# Patient Record
Sex: Female | Born: 1955 | Race: White | Marital: Married | Smoking: Light tobacco smoker
Health system: Southern US, Community
[De-identification: ages and names within clinical notes are randomized; demographics above are authoritative.]

## PROBLEM LIST (undated history)

## (undated) DIAGNOSIS — M797 Fibromyalgia: Secondary | ICD-10-CM

## (undated) DIAGNOSIS — G4733 Obstructive sleep apnea (adult) (pediatric): Secondary | ICD-10-CM

## (undated) DIAGNOSIS — J449 Chronic obstructive pulmonary disease, unspecified: Secondary | ICD-10-CM

## (undated) DIAGNOSIS — K5792 Diverticulitis of intestine, part unspecified, without perforation or abscess without bleeding: Secondary | ICD-10-CM

## (undated) DIAGNOSIS — F411 Generalized anxiety disorder: Secondary | ICD-10-CM

## (undated) DIAGNOSIS — M1990 Unspecified osteoarthritis, unspecified site: Secondary | ICD-10-CM

## (undated) DIAGNOSIS — I5032 Chronic diastolic (congestive) heart failure: Secondary | ICD-10-CM

## (undated) DIAGNOSIS — N289 Disorder of kidney and ureter, unspecified: Secondary | ICD-10-CM

## (undated) DIAGNOSIS — F909 Attention-deficit hyperactivity disorder, unspecified type: Secondary | ICD-10-CM

## (undated) DIAGNOSIS — A159 Respiratory tuberculosis unspecified: Secondary | ICD-10-CM

## (undated) DIAGNOSIS — T7840XA Allergy, unspecified, initial encounter: Secondary | ICD-10-CM

## (undated) DIAGNOSIS — G4730 Sleep apnea, unspecified: Secondary | ICD-10-CM

## (undated) DIAGNOSIS — G43909 Migraine, unspecified, not intractable, without status migrainosus: Secondary | ICD-10-CM

## (undated) DIAGNOSIS — N189 Chronic kidney disease, unspecified: Secondary | ICD-10-CM

## (undated) DIAGNOSIS — F329 Major depressive disorder, single episode, unspecified: Secondary | ICD-10-CM

## (undated) DIAGNOSIS — I700 Atherosclerosis of aorta: Secondary | ICD-10-CM

## (undated) DIAGNOSIS — D126 Benign neoplasm of colon, unspecified: Secondary | ICD-10-CM

## (undated) DIAGNOSIS — F419 Anxiety disorder, unspecified: Secondary | ICD-10-CM

## (undated) DIAGNOSIS — J45909 Unspecified asthma, uncomplicated: Secondary | ICD-10-CM

## (undated) DIAGNOSIS — I471 Supraventricular tachycardia, unspecified: Secondary | ICD-10-CM

## (undated) DIAGNOSIS — Z87820 Personal history of traumatic brain injury: Secondary | ICD-10-CM

## (undated) HISTORY — DX: Benign neoplasm of colon, unspecified: D12.6

## (undated) HISTORY — DX: Attention-deficit hyperactivity disorder, unspecified type: F90.9

## (undated) HISTORY — DX: Chronic kidney disease, unspecified: N18.9

## (undated) HISTORY — DX: Chronic diastolic (congestive) heart failure: I50.32

## (undated) HISTORY — DX: Anxiety disorder, unspecified: F41.9

## (undated) HISTORY — DX: Unspecified osteoarthritis, unspecified site: M19.90

## (undated) HISTORY — DX: Respiratory tuberculosis unspecified: A15.9

## (undated) HISTORY — PX: BACK SURGERY: SHX140

## (undated) HISTORY — DX: Atherosclerosis of aorta: I70.0

## (undated) HISTORY — DX: Migraine, unspecified, not intractable, without status migrainosus: G43.909

## (undated) HISTORY — DX: Generalized anxiety disorder: F41.1

## (undated) HISTORY — PX: ESOPHAGOGASTRODUODENOSCOPY: SHX1529

## (undated) HISTORY — PX: TUBAL LIGATION: SHX77

## (undated) HISTORY — DX: Supraventricular tachycardia, unspecified: I47.10

## (undated) HISTORY — DX: Chronic obstructive pulmonary disease, unspecified: J44.9

## (undated) HISTORY — DX: Sleep apnea, unspecified: G47.30

## (undated) HISTORY — DX: Obstructive sleep apnea (adult) (pediatric): G47.33

## (undated) HISTORY — PX: COLONOSCOPY: SHX174

## (undated) HISTORY — DX: Personal history of traumatic brain injury: Z87.820

## (undated) HISTORY — DX: Diverticulitis of intestine, part unspecified, without perforation or abscess without bleeding: K57.92

## (undated) HISTORY — DX: Unspecified asthma, uncomplicated: J45.909

## (undated) HISTORY — DX: Major depressive disorder, single episode, unspecified: F32.9

## (undated) HISTORY — DX: Allergy, unspecified, initial encounter: T78.40XA

## (undated) HISTORY — DX: Fibromyalgia: M79.7

---
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
## 2007-04-28 DIAGNOSIS — I639 Cerebral infarction, unspecified: Secondary | ICD-10-CM

## 2007-04-28 DIAGNOSIS — G459 Transient cerebral ischemic attack, unspecified: Secondary | ICD-10-CM

## 2007-04-28 HISTORY — DX: Transient cerebral ischemic attack, unspecified: G45.9

## 2007-04-28 HISTORY — DX: Cerebral infarction, unspecified: I63.9

## 2016-08-10 DIAGNOSIS — M48061 Spinal stenosis, lumbar region without neurogenic claudication: Secondary | ICD-10-CM | POA: Insufficient documentation

## 2016-08-10 HISTORY — DX: Spinal stenosis, lumbar region without neurogenic claudication: M48.061

## 2017-07-14 DIAGNOSIS — M21372 Foot drop, left foot: Secondary | ICD-10-CM | POA: Insufficient documentation

## 2017-07-14 DIAGNOSIS — M545 Low back pain, unspecified: Secondary | ICD-10-CM

## 2017-07-14 HISTORY — DX: Foot drop, left foot: M21.372

## 2017-07-14 HISTORY — DX: Low back pain, unspecified: M54.50

## 2019-03-28 ENCOUNTER — Other Ambulatory Visit: Payer: Self-pay | Admitting: Family Medicine

## 2019-03-28 DIAGNOSIS — R479 Unspecified speech disturbances: Secondary | ICD-10-CM

## 2019-04-12 ENCOUNTER — Other Ambulatory Visit: Payer: Self-pay | Admitting: Family Medicine

## 2019-04-13 ENCOUNTER — Other Ambulatory Visit: Payer: Self-pay | Admitting: Family Medicine

## 2019-04-17 ENCOUNTER — Other Ambulatory Visit: Payer: Self-pay | Admitting: Family Medicine

## 2019-04-18 ENCOUNTER — Ambulatory Visit
Admission: RE | Admit: 2019-04-18 | Discharge: 2019-04-18 | Disposition: A | Discharge status: Home / Self Care | Payer: Medicare Other | Source: Ambulatory Visit | Attending: Family Medicine | Admitting: Family Medicine

## 2019-04-18 ENCOUNTER — Other Ambulatory Visit: Payer: Self-pay

## 2019-04-18 DIAGNOSIS — R479 Unspecified speech disturbances: Secondary | ICD-10-CM

## 2019-04-18 IMAGING — MR MR HEAD W/O CM
11 series · 48 of 48 positions shown · non-contrast
Comparison: None.

CLINICAL DATA: Episodic ataxia and slurred speech.

EXAM:
MRI HEAD WITHOUT CONTRAST
TECHNIQUE: Multiplanar, multiecho pulse sequences of the brain and surrounding
structures were obtained without intravenous contrast.

[Series 5: T1 · sagittal · 4.0mm · 0.75mm/px · 3 of 31 slices shown (1 of 3)]
[im 1/31]
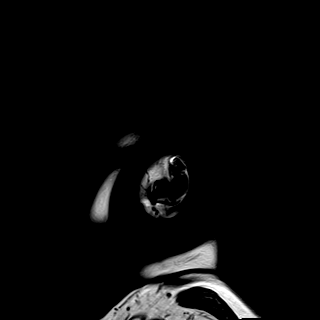
[im 16/31]
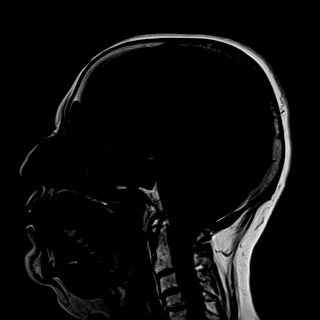
[im 31/31]
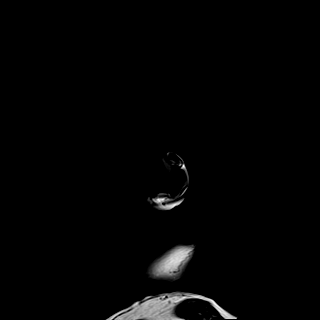

[Series 6: DWI · axial · 3.0mm · 1.44mm/px · z∈[-67,+75]mm · 7 of 88 slices shown (1 of 4)]
[im 1/88]
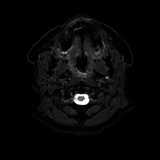
[im 15/88]
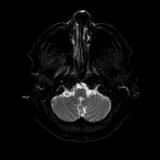
[im 30/88]
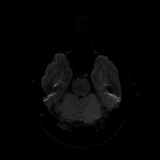
[im 44/88]
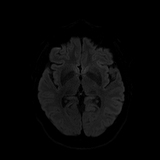
[im 59/88]
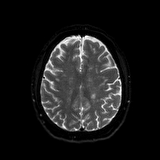
[im 73/88]
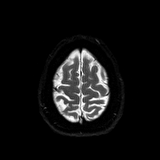
[im 88/88]
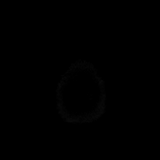

[Series 7: DWI · axial · 3.0mm · 1.44mm/px · z∈[-67,+75]mm · 3 of 43 slices shown (2 of 4)]
[im 1/43]
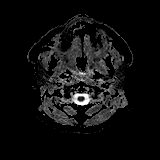
[im 22/43]
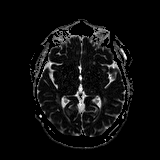
[im 43/43]
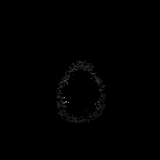

[Series 8: DWI · coronal · 5.0mm · 1.44mm/px · 5 of 62 slices shown (3 of 4)]
[im 1/62]
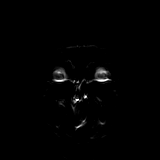
[im 16/62]
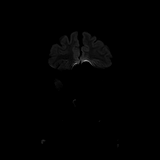
[im 31/62]
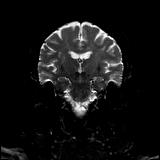
[im 46/62]
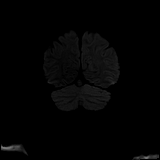
[im 62/62]
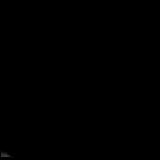

[Series 9: DWI · coronal · 5.0mm · 1.44mm/px · 2 of 30 slices shown (4 of 4)]
[im 1/30]
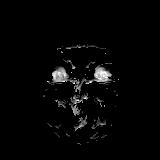
[im 30/30]
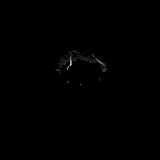

[Series 10: T1 · sagittal · 4.0mm · 0.75mm/px · 2 of 31 slices shown (2 of 3)]
[im 1/31]
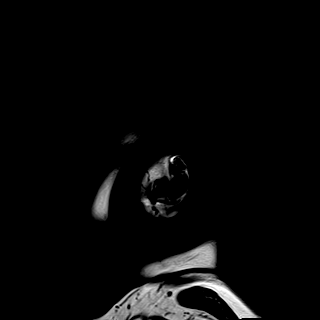
[im 31/31]
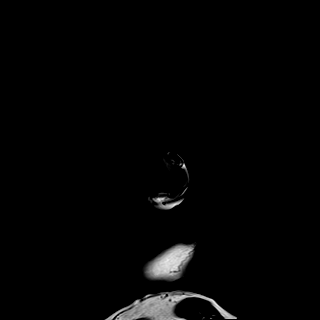

[Series 11: T2 · axial · 4.0mm · 0.36mm/px · z∈[-67,+78]mm · 2 of 29 slices shown (1 of 2)]
[im 1/29]
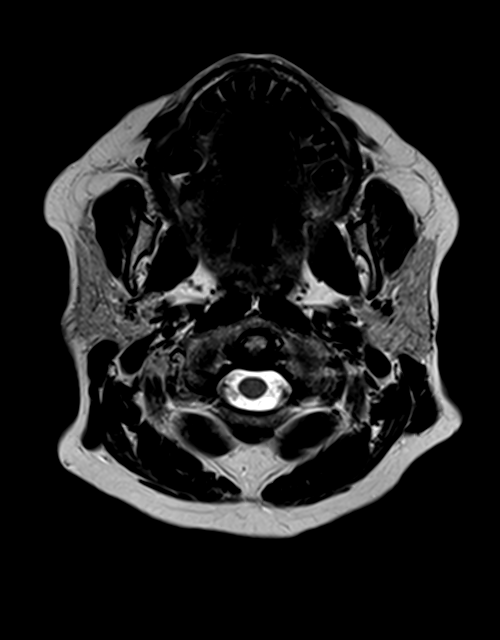
[im 29/29]
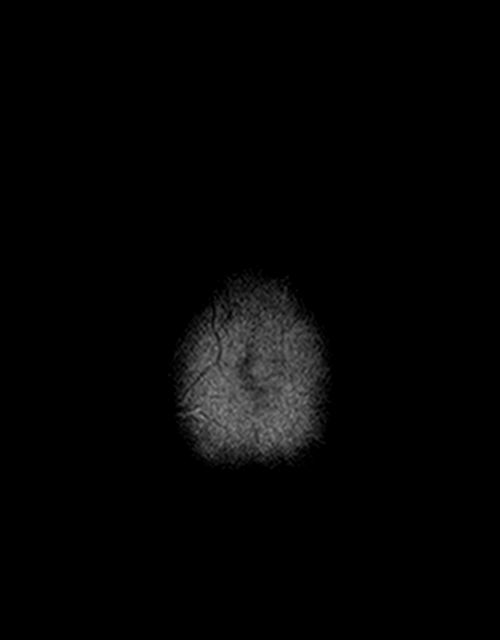

[Series 12: FLAIR · axial · 3.0mm · 0.72mm/px · z∈[-71,+79]mm · 2 of 26 slices shown]
[im 1/26]
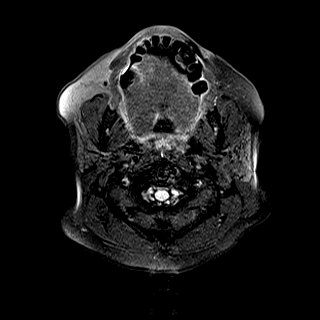
[im 26/26]
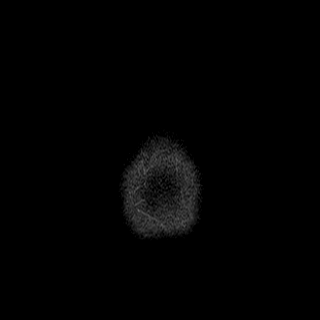

[Series 14: swi_images · axial · 1.5mm · 0.90mm/px · z∈[-66,+76]mm · 8 of 96 slices shown]
[im 1/96]
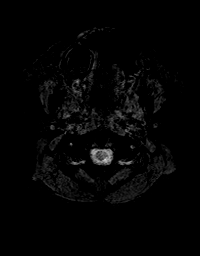
[im 14/96]
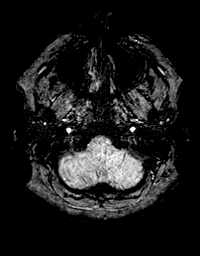
[im 28/96]
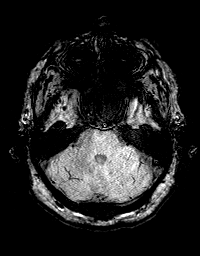
[im 41/96]
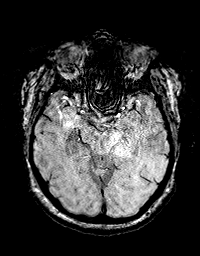
[im 55/96]
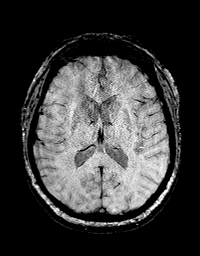
[im 68/96]
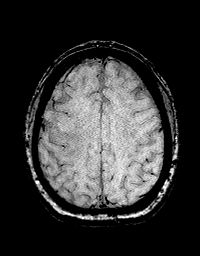
[im 82/96]
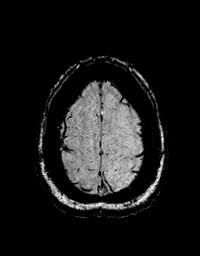
[im 96/96]
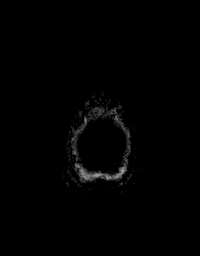

[Series 15: T1 · axial · 1.0mm · 0.94mm/px · z∈[-65,+77]mm · 11 of 144 slices shown (3 of 3)]
[im 1/144]
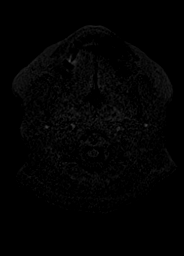
[im 15/144]
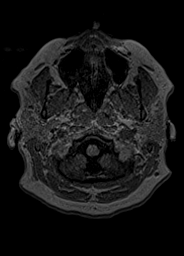
[im 29/144]
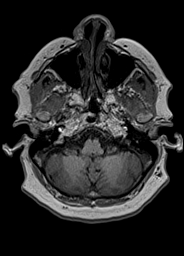
[im 43/144]
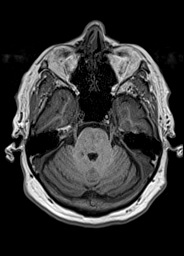
[im 58/144]
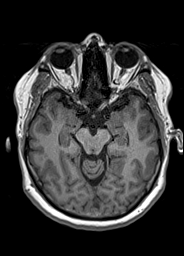
[im 72/144]
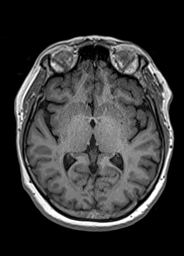
[im 86/144]
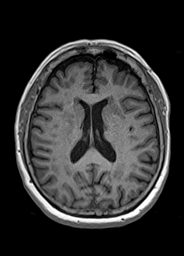
[im 101/144]
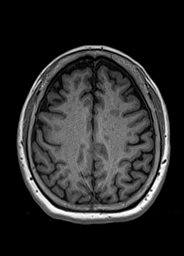
[im 115/144]
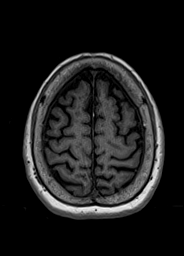
[im 129/144]
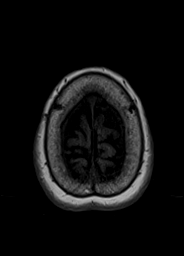
[im 144/144]
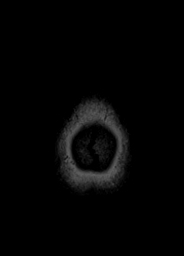

[Series 16: T2 · coronal · 4.5mm · 0.36mm/px · 3 of 32 slices shown (2 of 2)]
[im 1/32]
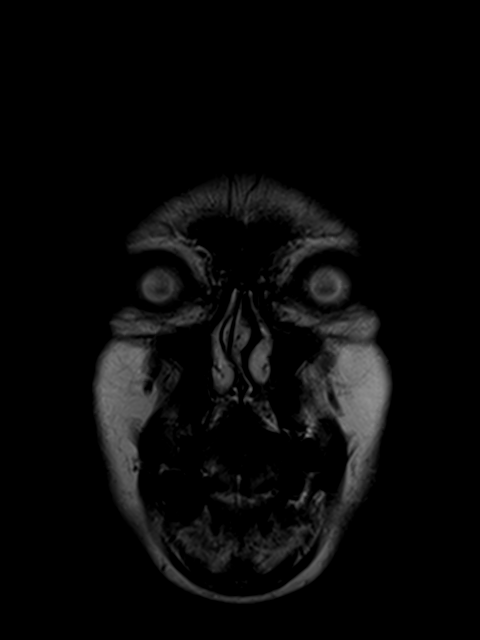
[im 16/32]
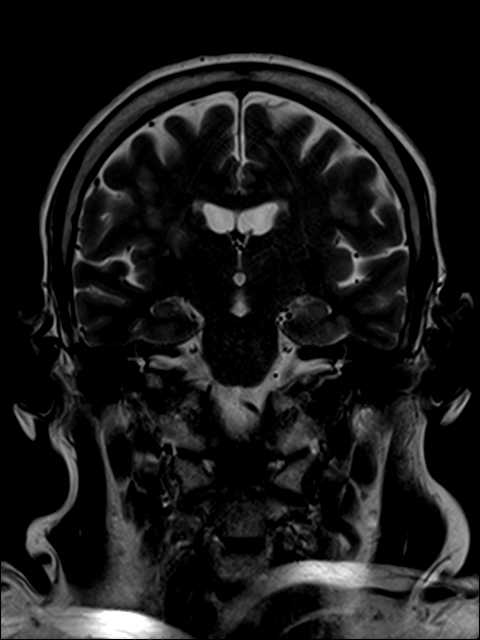
[im 32/32]
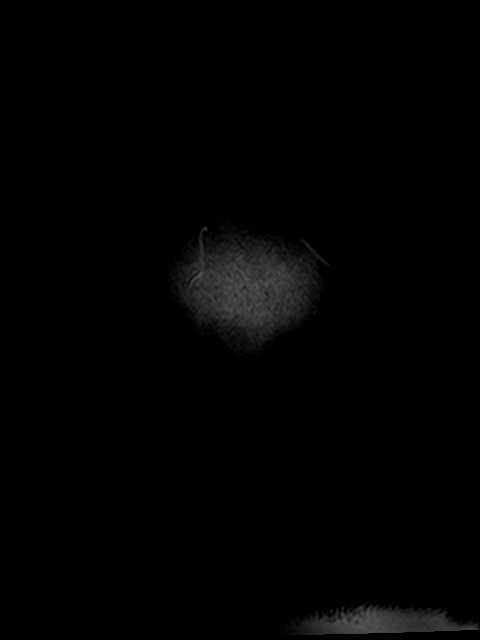

[48 of 48 positions shown; findings below may reference images not displayed]

FINDINGS: BRAIN: There is no acute infarct, acute hemorrhage or extra-axial
collection. Early confluent hyperintense T2-weighted signal of the
periventricular and deep white matter, most commonly due to chronic
ischemic microangiopathy. The cerebral and cerebellar volume are
age-appropriate. There is no hydrocephalus. The midline structures
are normal.

VASCULAR: The major intracranial arterial and venous sinus flow
voids are normal. Susceptibility-sensitive sequences show no chronic
microhemorrhage or superficial siderosis.

SKULL AND UPPER CERVICAL SPINE: Calvarial bone marrow signal is
normal. There is no skull base mass. The visualized upper cervical
spine and soft tissues are normal.

SINUSES/ORBITS: There are no fluid levels or advanced mucosal
thickening. The mastoid air cells and middle ear cavities are free
of fluid. The orbits are normal.
IMPRESSION: 1. No acute intracranial abnormality.
2. Moderate chronic small vessel disease.

## 2019-04-26 ENCOUNTER — Telehealth: Payer: Self-pay | Admitting: *Deleted

## 2019-04-26 ENCOUNTER — Other Ambulatory Visit: Payer: Self-pay | Admitting: *Deleted

## 2019-04-26 DIAGNOSIS — R55 Syncope and collapse: Secondary | ICD-10-CM

## 2019-04-26 NOTE — Telephone Encounter (Signed)
14 day ZIO XT long term holter monitor mailed to patients home.  Instructions reviewed briefly as they are included in the monitor kit. 

## 2019-05-01 ENCOUNTER — Other Ambulatory Visit (INDEPENDENT_AMBULATORY_CARE_PROVIDER_SITE_OTHER): Payer: Medicare Other

## 2019-05-01 DIAGNOSIS — R55 Syncope and collapse: Secondary | ICD-10-CM | POA: Diagnosis not present

## 2019-06-02 ENCOUNTER — Encounter: Payer: Self-pay | Admitting: Interventional Cardiology

## 2019-06-02 ENCOUNTER — Ambulatory Visit: Payer: Medicare Other | Admitting: Interventional Cardiology

## 2019-06-02 ENCOUNTER — Other Ambulatory Visit: Payer: Self-pay

## 2019-06-02 VITALS — BP 116/80 | HR 95 | Ht 59.0 in | Wt 181.0 lb

## 2019-06-02 DIAGNOSIS — I471 Supraventricular tachycardia: Secondary | ICD-10-CM | POA: Diagnosis not present

## 2019-06-02 DIAGNOSIS — Z720 Tobacco use: Secondary | ICD-10-CM

## 2019-06-02 DIAGNOSIS — R55 Syncope and collapse: Secondary | ICD-10-CM

## 2019-06-02 DIAGNOSIS — M79605 Pain in left leg: Secondary | ICD-10-CM

## 2019-06-02 NOTE — Patient Instructions (Signed)
Medication Instructions:  Your physician recommends that you continue on your current medications as directed. Please refer to the Current Medication list given to you today.  *If you need a refill on your cardiac medications before your next appointment, please call your pharmacy*  Lab Work: Your physician recommends that you return for a FASTING lipid profile  If you have labs (blood work) drawn today and your tests are completely normal, you will receive your results only by: Marland Kitchen MyChart Message (if you have MyChart) OR . A paper copy in the mail If you have any lab test that is abnormal or we need to change your treatment, we will call you to review the results.  Testing/Procedures: Your physician has requested that you have an echocardiogram. Echocardiography is a painless test that uses sound waves to create images of your heart. It provides your doctor with information about the size and shape of your heart and how well your heart's chambers and valves are working. This procedure takes approximately one hour. There are no restrictions for this procedure.    Follow-Up: At [facility], you and your health needs are our priority.  As part of our continuing mission to provide you with exceptional heart care, we have created designated Provider Care Teams.  These Care Teams include your primary Cardiologist (physician) and Advanced Practice Providers (APPs -  Physician Assistants and Nurse Practitioners) who all work together to provide you with the care you need, when you need it.  Your next appointment:   6 month(s)  The format for your next appointment:   In Person  Provider:   You may see Larae Grooms, MD or one of the following Advanced Practice Providers on your designated Care Team:    Melina Copa, PA-C  Ermalinda Barrios, PA-C   Other Instructions

## 2019-06-02 NOTE — Progress Notes (Signed)
Cardiology Office Note   Date:  06/02/2019   ID:  Angelica Carter, DOB 1955-06-27, MRN [id number]  PCP:  Buzzy Han, MD    No chief complaint on file.  SVT/syncope  Wt Readings from Last 3 Encounters:  06/02/19 181 lb (82.1 kg)       History of Present Illness: Angelica Carter is a 64 y.o. female who is being seen today for the evaluation of syncope at the request of [place]*.  She has had several episodes of "falling out" over the past few years.  From 12/2018-03/2019, she had 3 episodes.  In 03/2019, she was getting up from a chair and then fell backwards.  In 01/2019, she passed out while in a chair when visiting [place].  She went to the hospital and was found to be dehydrated and have low potassium.   Since 03/2019, no syncopal episodes.  She walks regularly, 3x/day.  Limited by left leg pain.  She has some pain in her calf with walking on the left.  SHe last had a stress test in 2019 in [place].  Denies : Chest pain. Leg edema. Nitroglycerin use. Orthopnea. Palpitations. Paroxysmal nocturnal dyspnea. Shortness of breath.   Past Medical History:  Diagnosis Date  . 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 Left foot drop 07/14/2017  . Low back pain 07/14/2017       Current Outpatient Medications  Medication Sig Dispense Refill  . albuterol (VENTOLIN HFA) 108 (90 Base) MCG/ACT inhaler Inhale into the lungs every 6 (six) hours as needed for wheezing or shortness of breath.    Marland Kitchen amLODipine (NORVASC) 10 MG tablet Take 10 mg by mouth daily.    . pregabalin (LYRICA) 200 MG capsule Take 200 mg by mouth 3 (three) times daily.    Orlie Dakin SODIUM PO Take by mouth.    . SUMAtriptan (IMITREX) 25 MG tablet Take 25 mg by mouth every 2 (two) hours as needed for migraine. May repeat in 2 hours if headache persists or recurs.    . traZODone (DESYREL) 50 MG tablet Take 75 mg by mouth at bedtime.     No current  facility-administered medications for this visit.    Allergies:   Cyclobenzaprine    Social History:  The patient  reports that she has been smoking. She has never used smokeless tobacco. She reports that she does not drink alcohol or use drugs.   Family History:  The patient's family history includes Heart disease in her father.    ROS:  Please see the history of present illness.   Otherwise, review of systems are positive for syncope several months ago; chronic leg pain.   All other systems are reviewed and negative.    PHYSICAL EXAM: VS:  BP 116/80   Pulse 95   Ht 4\' 11"  (1.499 m)   Wt 181 lb (82.1 kg)   SpO2 96%   BMI 36.56 kg/m  , BMI Body mass index is 36.56 kg/m. GEN: Well nourished, well developed, in no acute distress  HEENT: normal  Neck: no JVD, carotid bruits, or masses Cardiac: RRR; no murmurs, rubs, or gallops,no edema  Respiratory:  clear to auscultation bilaterally, normal work of breathing GI: soft, nontender, nondistended, + BS MS: no deformity or atrophy ; 2+ posterior tibial pulses bilaterally Skin: warm and dry, no rash Neuro:  Strength and sensation are intact Psych: euthymic mood, full affect   EKG:   The ekg ordered today demonstrates normal sinus rhythm, heart  rate at the upper end of normal range, no significant ST changes   Recent Labs: No results found for requested labs within last 8760 hours.   Lipid Panel No results found for: CHOL, TRIG, HDL, CHOLHDL, VLDL, LDLCALC, LDLDIRECT   Other studies Reviewed: Additional studies/ records that were reviewed today with results demonstrating: Labs from primary care doctor reviewed and electrolytes well-controlled..   ASSESSMENT AND PLAN:  1. Syncope: Check echo to look for  Structural heart issues.  She reports having a normal stress test done in 2019 while in [place].  No anginal symptoms at this time.  Zio monitor reviewed.  No sustained arrhythmias or anything that would explain syncope.   She did have short-lived SVT noted. 2. Leg pain: Palpable posterior tibial pulses bilaterally.  She has had multiple back surgeries.  This may be more of a neurologic issue. 3. Tobacco abuse: She needs to stop smoking. 4. I encouraged her to stay well-hydrated.  Healthy diet recommended.   Current medicines are reviewed at length with the patient today.  The patient concerns regarding her medicines were addressed.  The following changes have been made:  No change  Labs/ tests ordered today include: echo No orders of the defined types were placed in this encounter.   Recommend 150 minutes/week of aerobic exercise Low fat, low carb, high fiber diet recommended  Disposition:   FU in 6 months   Signed, Larae Grooms, MD  06/02/2019 10:09 AM    [facility] [place], [place], [place]  [address] Phone: [phone]; Fax: [phone]

## 2019-06-16 ENCOUNTER — Other Ambulatory Visit: Payer: Medicare Other

## 2019-06-16 ENCOUNTER — Other Ambulatory Visit (HOSPITAL_COMMUNITY): Payer: Medicare Other

## 2019-06-30 ENCOUNTER — Other Ambulatory Visit: Payer: Medicare Other | Admitting: *Deleted

## 2019-06-30 ENCOUNTER — Other Ambulatory Visit: Payer: Self-pay

## 2019-06-30 ENCOUNTER — Ambulatory Visit (HOSPITAL_COMMUNITY): Payer: Medicare Other | Attending: Interventional Cardiology

## 2019-06-30 DIAGNOSIS — R55 Syncope and collapse: Secondary | ICD-10-CM | POA: Insufficient documentation

## 2019-06-30 LAB — LIPID PANEL
Chol/HDL Ratio: 4.9 ratio — ABNORMAL HIGH (ref 0.0–4.4)
Cholesterol, Total: 186 mg/dL (ref 100–199)
HDL: 38 mg/dL — ABNORMAL LOW (ref >39)
LDL Chol Calc (NIH): 117 mg/dL — ABNORMAL HIGH (ref 0–99)
Triglycerides: 173 mg/dL — ABNORMAL HIGH (ref 0–149)
VLDL Cholesterol Cal: 31 mg/dL (ref 5–40)

## 2019-07-27 ENCOUNTER — Encounter: Payer: Self-pay | Admitting: Physician Assistant

## 2019-08-11 ENCOUNTER — Ambulatory Visit: Payer: Medicare Other | Admitting: Physician Assistant

## 2019-08-17 ENCOUNTER — Telehealth: Payer: Self-pay | Admitting: Physician Assistant

## 2019-08-17 ENCOUNTER — Ambulatory Visit: Payer: Medicare Other | Admitting: Physician Assistant

## 2019-08-17 ENCOUNTER — Encounter: Payer: Self-pay | Admitting: Physician Assistant

## 2019-08-17 VITALS — BP 142/92 | HR 100 | Temp 98.2°F | Ht 59.0 in | Wt 179.0 lb

## 2019-08-17 DIAGNOSIS — Z86010 Personal history of colonic polyps: Secondary | ICD-10-CM

## 2019-08-17 DIAGNOSIS — R1013 Epigastric pain: Secondary | ICD-10-CM

## 2019-08-17 DIAGNOSIS — R195 Other fecal abnormalities: Secondary | ICD-10-CM | POA: Diagnosis not present

## 2019-08-17 DIAGNOSIS — R1031 Right lower quadrant pain: Secondary | ICD-10-CM

## 2019-08-17 DIAGNOSIS — R1032 Left lower quadrant pain: Secondary | ICD-10-CM

## 2019-08-17 MED ORDER — SUTAB 1479-225-188 MG PO TABS: 1.0000 | ORAL_TABLET | Freq: Once | ORAL | 0 refills | Status: AC

## 2019-08-17 NOTE — Patient Instructions (Addendum)
If you are age 65 or older, your body mass index should be between 23-30. Your Body mass index is 36.15 kg/m. If this is out of the aforementioned range listed, please consider follow up with your Primary Care Provider.  If you are age 2 or younger, your body mass index should be between 19-25. Your Body mass index is 36.15 kg/m. If this is out of the aformentioned range listed, please consider follow up with your Primary Care Provider.   Please purchase the following medications over the counter and take as directed: Omeprazole 20 mg daily, 30-60 minutes before breakfast for 2 weeks to see if helps with gas/epigastric pain.   You have been scheduled for a colonoscopy. Please follow written instructions given to you at your visit today.  Please pick up your prep supplies at the pharmacy within the next 1-3 days. If you use inhalers (even only as needed), please bring them with you on the day of your procedure. Marland Kitchen

## 2019-08-17 NOTE — Progress Notes (Addendum)
Chief Complaint: Positive FIT test, diarrhea and abdominal pain  HPI:    Angelica Carter is a 64 year old female with a past medical history as listed below (06/30/2019 echo with a normal LVEF at 65-70%), who was referred to me by Buzzy Han* for a complaint of positive fit test, diarrhea and abdominal pain.      05/19/2019 patient seen by her PCP for follow-up.  At that time patient had a fit test ordered which returned positive.  She was referred to our clinic.    Today, the patient tells me that she has a GI history of diverticulitis in the past as well as colon polyps with her last colonoscopy at least 7 years ago when she lived in [place].  Currently describes that she continues with a lower abdominal pain described as a cramping rated as a 7/10 which happens anytime that she eats, typically within an hour after eating.  This will last for a few hours and then go away and does not necessarily result in a bowel movement.  This has been going on for 2 years and apparently IBS has been discussed in the past.  Tells me now that her stools are rarely formed, though she does have some formed stools here and there and there is also some mucus on the stool.    Also describes a history of reflux but tells me she is not currently having issues with this.  Has used Tums as needed in the past.  Does relate a history of a lot of gas.    Denies fever, chills, weight loss or symptoms that awaken her from sleep.  Past Medical History:  Diagnosis Date  . 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 Kidney disease    only has 1 kidney   . Left foot drop 07/14/2017  . Low back pain 07/14/2017    Past Surgical History:  Procedure Laterality Date  . BACK SURGERY     x 4   . TUBAL LIGATION      Current Outpatient Medications  Medication Sig Dispense Refill  . albuterol (VENTOLIN HFA) 108 (90 Base) MCG/ACT inhaler Inhale into the lungs every 6 (six) hours as needed for wheezing or  shortness of breath.    Marland Kitchen amLODipine (NORVASC) 10 MG tablet Take 10 mg by mouth daily.    . pregabalin (LYRICA) 200 MG capsule Take 200 mg by mouth 3 (three) times daily.    Orlie Dakin SODIUM PO Take by mouth.    . SUMAtriptan (IMITREX) 25 MG tablet Take 25 mg by mouth every 2 (two) hours as needed for migraine. May repeat in 2 hours if headache persists or recurs.    . traZODone (DESYREL) 50 MG tablet Take 75 mg by mouth at bedtime.     No current facility-administered medications for this visit.    Allergies as of 08/17/2019 - Review Complete 08/17/2019  Allergen Reaction Noted  . Cyclobenzaprine Other (See Comments) 06/02/2019    Family History  Problem Relation Age of Onset  . Colon cancer Mother 30  . Heart disease Father   . Colon polyps Sister   . Stomach cancer Neg Hx   . Pancreatic cancer Neg Hx   . Esophageal cancer Neg Hx     Social History   Socioeconomic History  . Marital status: Married    Spouse name: Not on file  . Number of children: Not on file  . Years of education: Not on file  . Highest education  level: Not on file  Occupational History  . Not on file  Tobacco Use  . Smoking status: Light Tobacco Smoker  . Smokeless tobacco: Never Used  Substance and Sexual Activity  . Alcohol use: Never  . Drug use: Never  . Sexual activity: Not on file  Other Topics Concern  . Not on file  Social History Narrative  . Not on file   Social Determinants of Health   Financial Resource Strain:   . Difficulty of Paying Living Expenses:   Food Insecurity:   . Worried About Charity fundraiser in the Last Year:   . Arboriculturist in the Last Year:   Transportation Needs:   . Film/video editor (Medical):   Marland Kitchen Lack of Transportation (Non-Medical):   Physical Activity:   . Days of Exercise per Week:   . Minutes of Exercise per Session:   Stress:   . Feeling of Stress :   Social Connections:   . Frequency of Communication with Friends and  Family:   . Frequency of Social Gatherings with Friends and Family:   . Attends Religious Services:   . Active Member of Clubs or Organizations:   . Attends Archivist Meetings:   Marland Kitchen Marital Status:   Intimate Partner Violence:   . Fear of Current or Ex-Partner:   . Emotionally Abused:   Marland Kitchen Physically Abused:   . Sexually Abused:     Review of Systems:    Constitutional: No weight loss, fever or chills Skin: No rash Cardiovascular: No chest pain Respiratory: No SOB Gastrointestinal: See HPI and otherwise negative Genitourinary: No dysuria  Neurological: No headache Musculoskeletal: No new muscle or joint pain Hematologic: No bleeding  Psychiatric: No history of depression or anxiety   Physical Exam:  Vital signs: BP (!) 142/92   Pulse 100   Temp 98.2 F (36.8 C)   Ht 4\' 11"  (1.499 m)   Wt 179 lb (81.2 kg)   BMI 36.15 kg/m   Constitutional:   Pleasant overweight Caucasian female appears to be in NAD, Well developed, Well nourished, alert and cooperative Head:  Normocephalic and atraumatic. Eyes:   PEERL, EOMI. No icterus. Conjunctiva pink. Ears:  Normal auditory acuity. Neck:  Supple Throat: Oral cavity and pharynx without inflammation, swelling or lesion.  Respiratory: Respirations even and unlabored. Lungs clear to auscultation bilaterally.   No wheezes, crackles, or rhonchi.  Cardiovascular: Normal S1, S2. No MRG. Regular rate and rhythm. No peripheral edema, cyanosis or pallor.  Gastrointestinal:  Soft, nondistended, moderate epigastric ttp with involuntary guarding. No rebound or guarding. Normal bowel sounds. No appreciable masses or hepatomegaly. Rectal:  Not performed.  Msk:  Symmetrical without gross deformities. Without edema, no deformity or joint abnormality.  Neurologic:  Alert and  oriented x4;  grossly normal neurologically.  Skin:   Dry and intact without significant lesions or rashes. Psychiatric: Demonstrates good judgement and reason without  abnormal affect or behaviors.  No recent labs or imaging.  Assessment: 1.  History of polyps: Per patient on multiple colonoscopies in the past, requesting records 2.  Positive fit test 3.  Lower abdominal cramping pain: Likely IBS as this is after eating and bowel movements are typically unformed 4.  Epigastric pain: Consider relation to gastritis, patient did not complain of heartburn or reflux today but was sensitive at time of exam  Plan: 1.  Scheduled patient for a colonoscopy due to positive FIT test in the [place] with Dr. Havery Moros.  Did discuss risks, benefits, limitations and alternatives and the patient agrees to proceed. 2.  Discussed lower abdominal cramping pain, likely this is IBS but we can discuss further after time of colonoscopy to ensure there is no other reason that she would be having this. 3.  Start the patient on over-the-counter Omeprazole 20 mg daily 30 minutes before breakfast for the next 2 weeks to see if this helps with her epigastric pain discovered at time of exam. 4.  We will request records from patient's last GI physician in [facility], Dr. Bonne Dolores. 5.  Patient to follow in clinic per recommendations from Dr. Havery Moros after time procedure.  Did tell her it may benefit her to follow-up about a month or so after to discuss IBS further if there is no other findings.  Ellouise Newer, PA-C [facility] 08/17/2019, 10:56 AM  Cc: Buzzy Han*   08/29/2019 859 addendum: Records received from [facility] endoscopy in [place].  11/01/2006 colonoscopy with 2 small polyps removed.  Pathology showed tubular adenomas.  11/21/2007 EGD was normal.  Pathology from distal esophagus showed mild hyperplasia of esophageal mucosa consistent with reflux esophagitis.  06/04/2011 colonoscopy showed 8 polyps removed and diverticulosis.  Pathology from descending, transverse and sigmoid colon showed tubular adenomas.  No change to  plans.  Ellouise Newer, PA-C

## 2019-08-17 NOTE — Progress Notes (Signed)
Agree with assessment and plan as outlined.  

## 2019-08-18 NOTE — Telephone Encounter (Signed)
Informed patient that I had a sample available and I would place it up front for her to pick up. Patient stated she will come by on Monday.

## 2019-08-25 ENCOUNTER — Encounter: Payer: Self-pay | Admitting: Gastroenterology

## 2019-08-25 ENCOUNTER — Ambulatory Visit (AMBULATORY_SURGERY_CENTER): Payer: Medicare Other | Admitting: Gastroenterology

## 2019-08-25 ENCOUNTER — Other Ambulatory Visit: Payer: Self-pay

## 2019-08-25 VITALS — BP 135/76 | HR 86 | Temp 97.3°F | Resp 21 | Ht 59.0 in | Wt 179.0 lb

## 2019-08-25 DIAGNOSIS — R195 Other fecal abnormalities: Secondary | ICD-10-CM

## 2019-08-25 DIAGNOSIS — D122 Benign neoplasm of ascending colon: Secondary | ICD-10-CM

## 2019-08-25 DIAGNOSIS — D123 Benign neoplasm of transverse colon: Secondary | ICD-10-CM

## 2019-08-25 DIAGNOSIS — D125 Benign neoplasm of sigmoid colon: Secondary | ICD-10-CM

## 2019-08-25 MED ORDER — SODIUM CHLORIDE 0.9 % IV SOLN: 500.0000 mL | Freq: Once | INTRAVENOUS | Status: DC

## 2019-08-25 NOTE — Progress Notes (Signed)
To PACU, VSS. Report to [email or web address] 

## 2019-08-25 NOTE — Op Note (Signed)
[facility] Patient Name: Angelica Carter Procedure Date: 08/25/2019 1:12 PM MRN: [id number] Endoscopist: Remo Lipps P. Havery Moros , MD Age: 64 Referring MD:  Date of Birth: 1956-01-28 Gender: Female Account #: [id number] Procedure:                Colonoscopy Indications:              Positive fecal immunochemical test, patient with                            reported history of colon polyps - last exam done 7                            years ago, mother with colon cancer diagnosed age                            29s Medicines:                Monitored Anesthesia Care Procedure:                Pre-Anesthesia Assessment:                           - Prior to the procedure, a History and Physical                            was performed, and patient medications and                            allergies were reviewed. The patient's tolerance of                            previous anesthesia was also reviewed. The risks                            and benefits of the procedure and the sedation                            options and risks were discussed with the patient.                            All questions were answered, and informed consent                            was obtained. Prior Anticoagulants: The patient has                            taken no previous anticoagulant or antiplatelet                            agents. ASA Grade Assessment: III - A patient with                            severe systemic disease. After reviewing the risks  and benefits, the patient was deemed in                            satisfactory condition to undergo the procedure.                           After obtaining informed consent, the colonoscope                            was passed under direct vision. Throughout the                            procedure, the patient's blood pressure, pulse, and                            oxygen saturations were monitored continuously. The                             Colonoscope was introduced through the anus and                            advanced to the the cecum, identified by                            appendiceal orifice and ileocecal valve. The                            colonoscopy was performed without difficulty. The                            patient tolerated the procedure well. The quality                            of the bowel preparation was adequate. The                            ileocecal valve, appendiceal orifice, and rectum                            were photographed. Scope In: 1:32:13 PM Scope Out: 1:58:48 PM Scope Withdrawal Time: 0 hours 16 minutes 8 seconds  Total Procedure Duration: 0 hours 26 minutes 35 seconds  Findings:                 The perianal and digital rectal examinations were                            normal.                           The terminal ileum appeared normal.                           A 4 mm polyp was found in the ascending colon. The  polyp was sessile. The polyp was removed with a                            cold snare. Resection and retrieval were complete.                           A 3 mm polyp was found in the hepatic flexure. The                            polyp was sessile. The polyp was removed with a                            cold biopsy forceps. Resection and retrieval were                            complete.                           Three sessile polyps were found in the transverse                            colon. The polyps were 4 to 10 mm in size. These                            polyps were removed with a cold snare. Resection                            and retrieval were complete.                           Two sessile polyps were found in the sigmoid colon.                            The polyps were 4 to 6 mm in size. These polyps                            were removed with a cold snare. Resection and                             retrieval were complete.                           Multiple medium-mouthed diverticula were found in                            the entire colon.                           The exam was otherwise without abnormality.                           Biopsies for histology were taken with a cold  forceps from the right colon, left colon and                            transverse colon for evaluation of microscopic                            colitis. Complications:            No immediate complications. Estimated blood loss:                            Minimal. Estimated Blood Loss:     Estimated blood loss was minimal. Impression:               - The examined portion of the ileum was normal.                           - One 4 mm polyp in the ascending colon, removed                            with a cold snare. Resected and retrieved.                           - One 3 mm polyp at the hepatic flexure, removed                            with a cold biopsy forceps. Resected and retrieved.                           - Three 4 to 10 mm polyps in the transverse colon,                            removed with a cold snare. Resected and retrieved.                           - Two 4 to 6 mm polyps in the sigmoid colon,                            removed with a cold snare. Resected and retrieved.                           - Diverticulosis in the entire examined colon.                           - The examination was otherwise normal.                           - Biopsies were taken with a cold forceps from the                            right colon, left colon and transverse colon for                            evaluation  of microscopic colitis. Recommendation:           - Patient has a contact number available for                            emergencies. The signs and symptoms of potential                            delayed complications were discussed with the                             patient. Return to normal activities tomorrow.                            Written discharge instructions were provided to the                            patient.                           - Resume previous diet.                           - Continue present medications.                           - Await pathology results.                           - No further stool testing recommended given                            history of polyps and FH of colon cancer Ramon Zanders P. Havery Moros, MD 08/25/2019 2:08:38 PM This report has been signed electronically.

## 2019-08-25 NOTE — Patient Instructions (Signed)
Handouts provided on polyps and diverticulosis.  ? ?YOU HAD AN ENDOSCOPIC PROCEDURE TODAY AT THE [facility]:   Refer to the procedure report that was given to you for any specific questions about what was found during the examination.  If the procedure report does not answer your questions, please call your gastroenterologist to clarify.  If you requested that your care partner not be given the details of your procedure findings, then the procedure report has been included in a sealed envelope for you to review at your convenience later. ? ?YOU SHOULD EXPECT: Some feelings of bloating in the abdomen. Passage of more gas than usual.  Walking can help get rid of the air that was put into your GI tract during the procedure and reduce the bloating. If you had a lower endoscopy (such as a colonoscopy or flexible sigmoidoscopy) you may notice spotting of blood in your stool or on the toilet paper. If you underwent a bowel prep for your procedure, you may not have a normal bowel movement for a few days. ? ?Please Note:  You might notice some irritation and congestion in your nose or some drainage.  This is from the oxygen used during your procedure.  There is no need for concern and it should clear up in a day or so. ? ?SYMPTOMS TO REPORT IMMEDIATELY: ? ?Following lower endoscopy (colonoscopy or flexible sigmoidoscopy): ? Excessive amounts of blood in the stool ? Significant tenderness or worsening of abdominal pains ? Swelling of the abdomen that is new, acute ? Fever of 100?F or higher ? ?For urgent or emergent issues, a gastroenterologist can be reached at any hour by calling [phone]. ?Do not use MyChart messaging for urgent concerns.  ? ? ?DIET:  We do recommend a small meal at first, but then you may proceed to your regular diet.  Drink plenty of fluids but you should avoid alcoholic beverages for 24 hours. ? ?ACTIVITY:  You should plan to take it easy for the rest of today and you should NOT  DRIVE or use heavy machinery until tomorrow (because of the sedation medicines used during the test).   ? ?FOLLOW UP: ?Our staff will call the number listed on your records 48-72 hours following your procedure to check on you and address any questions or concerns that you may have regarding the information given to you following your procedure. If we do not reach you, we will leave a message.  We will attempt to reach you two times.  During this call, we will ask if you have developed any symptoms of COVID 19. If you develop any symptoms (ie: fever, flu-like symptoms, shortness of breath, cough etc.) before then, please call [phone].  If you test positive for Covid 19 in the 2 weeks post procedure, please call and report this information to us.   ? ?If any biopsies were taken you will be contacted by phone or by letter within the next 1-3 weeks.  Please call us at [phone] if you have not heard about the biopsies in 3 weeks.  ? ? ?SIGNATURES/CONFIDENTIALITY: ?You and/or your care partner have signed paperwork which will be entered into your electronic medical record.  These signatures attest to the fact that that the information above on your After Visit Summary has been reviewed and is understood.  Full responsibility of the confidentiality of this discharge information lies with you and/or your care-partner. ? ?

## 2019-08-25 NOTE — Progress Notes (Signed)
Called to room to assist during endoscopic procedure.  Patient ID and intended procedure confirmed with present staff. Received instructions for my participation in the procedure from the performing physician.  

## 2019-08-29 ENCOUNTER — Telehealth: Payer: Self-pay

## 2019-08-29 NOTE — Telephone Encounter (Signed)
First post procedure follow up call, no answer 

## 2019-08-29 NOTE — Telephone Encounter (Signed)
LVM

## 2019-08-30 ENCOUNTER — Encounter: Payer: Self-pay | Admitting: Gastroenterology

## 2019-12-26 ENCOUNTER — Ambulatory Visit: Payer: Medicare Other | Admitting: Gastroenterology

## 2019-12-26 ENCOUNTER — Telehealth: Payer: Self-pay | Admitting: Gastroenterology

## 2019-12-26 NOTE — Telephone Encounter (Signed)
Patient with several days of abdominal pain.  She was asked to call us by her PCP.  She will come in and see Carl Best, RNP on 01/05/20.  She was offered an appt for today at 2:00, but declined

## 2019-12-26 NOTE — Telephone Encounter (Signed)
Patient notified that offered spot is no longer available and no earlier appt until next Friday

## 2020-01-05 ENCOUNTER — Ambulatory Visit: Payer: Medicare Other | Admitting: Nurse Practitioner

## 2020-01-19 ENCOUNTER — Encounter: Payer: Self-pay | Admitting: Neurology

## 2020-02-08 ENCOUNTER — Encounter: Payer: Self-pay | Admitting: Nurse Practitioner

## 2020-02-08 ENCOUNTER — Ambulatory Visit: Payer: Medicare Other | Admitting: Nurse Practitioner

## 2020-02-08 ENCOUNTER — Other Ambulatory Visit (INDEPENDENT_AMBULATORY_CARE_PROVIDER_SITE_OTHER): Payer: Medicare Other

## 2020-02-08 VITALS — BP 110/80 | HR 81 | Ht 59.0 in | Wt 177.0 lb

## 2020-02-08 DIAGNOSIS — R197 Diarrhea, unspecified: Secondary | ICD-10-CM

## 2020-02-08 DIAGNOSIS — Z8719 Personal history of other diseases of the digestive system: Secondary | ICD-10-CM

## 2020-02-08 DIAGNOSIS — R1032 Left lower quadrant pain: Secondary | ICD-10-CM | POA: Diagnosis not present

## 2020-02-08 HISTORY — DX: Left lower quadrant pain: R10.32

## 2020-02-08 LAB — CBC WITH DIFFERENTIAL/PLATELET
Basophils Absolute: 0.1 10*3/uL (ref 0.0–0.1)
Basophils Relative: 1.2 % (ref 0.0–3.0)
Eosinophils Absolute: 0.2 10*3/uL (ref 0.0–0.7)
Eosinophils Relative: 2.8 % (ref 0.0–5.0)
HCT: 44.4 % (ref 36.0–46.0)
Hemoglobin: 14.5 g/dL (ref 12.0–15.0)
Lymphocytes Relative: 28.6 % (ref 12.0–46.0)
Lymphs Abs: 1.8 10*3/uL (ref 0.7–4.0)
MCHC: 32.6 g/dL (ref 30.0–36.0)
MCV: 85.5 fl (ref 78.0–100.0)
Monocytes Absolute: 0.5 10*3/uL (ref 0.1–1.0)
Monocytes Relative: 8.4 % (ref 3.0–12.0)
Neutro Abs: 3.6 10*3/uL (ref 1.4–7.7)
Neutrophils Relative %: 59 % (ref 43.0–77.0)
Platelets: 183 10*3/uL (ref 150.0–400.0)
RBC: 5.19 Mil/uL — ABNORMAL HIGH (ref 3.87–5.11)
RDW: 15.5 % (ref 11.5–15.5)
WBC: 6.1 10*3/uL (ref 4.0–10.5)

## 2020-02-08 LAB — COMPREHENSIVE METABOLIC PANEL
ALT: 71 U/L — ABNORMAL HIGH (ref 0–35)
AST: 44 U/L — ABNORMAL HIGH (ref 0–37)
Albumin: 4.4 g/dL (ref 3.5–5.2)
Alkaline Phosphatase: 58 U/L (ref 39–117)
BUN: 15 mg/dL (ref 6–23)
CO2: 27 mEq/L (ref 19–32)
Calcium: 9.6 mg/dL (ref 8.4–10.5)
Chloride: 103 mEq/L (ref 96–112)
Creatinine, Ser: 1.12 mg/dL (ref 0.40–1.20)
GFR: 51.38 mL/min — ABNORMAL LOW (ref >60.00)
Glucose, Bld: 103 mg/dL — ABNORMAL HIGH (ref 70–99)
Potassium: 4.1 mEq/L (ref 3.5–5.1)
Sodium: 138 mEq/L (ref 135–145)
Total Bilirubin: 0.5 mg/dL (ref 0.2–1.2)
Total Protein: 7.6 g/dL (ref 6.0–8.3)

## 2020-02-08 LAB — C-REACTIVE PROTEIN: CRP: <1 mg/dL (ref 0.5–20.0)

## 2020-02-08 MED ORDER — DICYCLOMINE HCL 10 MG PO CAPS: 10.0000 mg | ORAL_CAPSULE | Freq: Three times a day (TID) | ORAL | 0 refills | Status: DC | PRN

## 2020-02-08 NOTE — Progress Notes (Signed)
02/08/2020 Angelica Carter [id number] 07/02/1955   Chief Complaint: LLQ pain  History of Present Illness: Angelica Carter is a 64 year old female with a past medical history of anxiety, depression, "mini strokes x 3 2010 or 2011, she was born with one kidney,  asthma, COPD, sleep apnea not currently using cpap, migraine headaches, back pain, syncope secondary to taking muscle relaxants, diverticulitis and colon polyps. She had Covid infection 11/25/2019. She presents to our office today with complains of LLQ abdominal pain which initially started 5 years ago. She reports being diagnosed with diverticulitis a least 2 to 3 times in her lifetime with the most recent episode occurred approximately 4 years ago which required hospital admission. She complains of having recurrent sharp LLQ pain for the past several weeks which has progressively worsened. She rates her LLQ pain a 10 out of 10 when it is most severe. She is taking an old prescription of Hydrocodone one tab as needed, last dose was taken 2 days ago. She is passing dark brown loose stools 1 to 5 times in the morning. Sometimes her stool is looks like bits of brown coffee grounds mixed with water. No bloody diarrhea or melena.  She pushes on her LLQ to facilitate the passage of stool/diarrhea. She always feels bloated. She was last prescribed an antibiotic 6 weeks ago by her PCP for her LLQ pain without improvement. No upper abdominal pain. No fever, sweats or chills. No weight loss.  Her most recent colonoscopy was done 08/25/2019, 8 polyps were removed and diverticulosis throughout the colon were noted. No evidence of IBD or microscopic colitis. See colonoscopy results below. Mother with history of colon cancer diagnosed in her 34. She has chronic back pain, 4 back surgeries in the past. No NSAID use. She takes Tylenol as needed. She report being born with one kidney with possible renal insufficiency in her one kidney.   Colonoscopy 08/25/2019 by Dr.  Havery Moros: - The examined portion of the ileum was normal. - One 4 mm polyp in the ascending colon, removed - One 4 mm polyp in the ascending colon, removed with a cold snare. Resected and retrieved. - One 3 mm polyp at the hepatic flexure, removed with a cold biopsy forceps. Resected and retrieved. - Three 4 to 10 mm polyps in the transverse colon, removed with a cold snare. Resected and retrieved. - Two 4 to 6 mm polyps in the sigmoid colon, removed with a cold snare. Resected and retrieved. - Diverticulosis in the entire examined colon. - The examination was otherwise normal. - Biopsies were taken with a cold forceps from the right colon, left colon and transverse colon for evaluation of microscopic colitis. - 3 year recall colonoscopy  1. Surgical [P], colon, sigmoid, transverse, ascending, hepatic flexure, polyp (7) - MULTIPLE FRAGMENTS OF TUBULAR ADENOMA(S) - NO HIGH GRADE DYSPLASIA OR MALIGNANCY IDENTIFIED 2. Surgical [P], right colon, transverse, left colon - BENIGN COLONIC MUCOSA - NO ACTIVE INFLAMMATION OR EVIDENCE OF MICROSCOPIC COLITIS - NO HIGH GRADE DYSPLASIA OR MALIGNANCY IDENTIFIED  Current Outpatient Medications on File Prior to Visit  Medication Sig Dispense Refill  . amLODipine (NORVASC) 10 MG tablet Take 10 mg by mouth daily.    Marland Kitchen buPROPion (WELLBUTRIN XL) 150 MG 24 hr tablet Take 150 mg by mouth daily.    . ondansetron (ZOFRAN) 4 MG tablet Take 4 mg by mouth 3 (three) times daily as needed.    . pantoprazole (PROTONIX) 40 MG tablet Take 40 mg by  mouth daily.    . pregabalin (LYRICA) 200 MG capsule Take 200 mg by mouth 3 (three) times daily.    Orlie Dakin SODIUM PO Take by mouth.    . SUMAtriptan (IMITREX) 25 MG tablet Take 25 mg by mouth every 2 (two) hours as needed for migraine. May repeat in 2 hours if headache persists or recurs.    . SYMBICORT 80-4.5 MCG/ACT inhaler Inhale 1 puff into the lungs 2 (two) times daily.    . traZODone (DESYREL) 50 MG  tablet Take 75 mg by mouth at bedtime.     No current facility-administered medications on file prior to visit.   Allergies  Allergen Reactions  . Cyclobenzaprine Other (See Comments)    ADVERSE REACTION     Current Medications, Allergies, Past Medical History, Past Surgical History, Family History and Social History were reviewed in [employer] record.   Review of Systems:   Constitutional: Negative for fever, sweats, chills or weight loss.  Respiratory: Negative for shortness of breath.   Cardiovascular: Negative for chest pain, palpitations and leg swelling.  Gastrointestinal: See HPI.  Musculoskeletal: Negative for back pain or muscle aches.  Neurological: Negative for dizziness, headaches or paresthesias.    Physical Exam: BP 110/80   Pulse 81   Ht 4\' 11"  (1.499 m)   Wt 177 lb (80.3 kg)   SpO2 97%   BMI 35.75 kg/m   General: Well developed 64 year old female in no acute distress. Head: Normocephalic and atraumatic. Eyes: No scleral icterus. Conjunctiva pink . Ears: Normal auditory acuity. Mouth: Dentition intact. No ulcers or lesions.  Lungs: Clear throughout to auscultation. Heart: Regular rate and rhythm, no murmur. Abdomen: Soft, nondistended. Moderate LLQ tenderness without rebound or guarding. Mild epigastric tenderness. No masses or hepatomegaly. Normal bowel sounds x 4 quadrants.  Rectal: Deferred.  Musculoskeletal: Symmetrical with no gross deformities. Extremities: No edema. Neurological: Alert oriented x 4. No focal deficits.  Psychological: Alert and cooperative. Normal mood and affect  Assessment and Recommendations:  38. 64 year old female a history of diverticulitis presents with LLQ. -Stat CTAP with oral contrast only to rule out diverticulitis vs colitis  -CBC, CMP and CRP -Dicyclomine 10mg  one po Q 8 hrs PRN abdominal pain -Push fluids, bland diet -To the ED if severe pain occurs  -Further follow up to be determined  after labs and CTAP results received   2. History of colon polyps Next colonoscopy due April 2024  3. GERD -Continue Pantoprazole 40mg  daily for now

## 2020-02-08 NOTE — Patient Instructions (Addendum)
If you are age 65 or older, your body mass index should be between 23-30. Your Body mass index is 35.75 kg/m. If this is out of the aforementioned range listed, please consider follow up with your Primary Care Provider.  If you are age 76 or younger, your body mass index should be between 19-25. Your Body mass index is 35.75 kg/m. If this is out of the aformentioned range listed, please consider follow up with your Primary Care Provider.   Your provider has requested that you go to the basement level for lab work before leaving today. Press "B" on the elevator. The lab is located at the first door on the left as you exit the elevator.  You have been scheduled for a CT scan of the abdomen and pelvis at [facility], 1st floor Radiology. You are scheduled on 02/09/20  at 3:45 pm. You should arrive 15 minutes prior to your appointment time for registration.  Please pick up 2 bottles of contrast from [facility] at least 3 days prior to your scan. The solution may taste better if refrigerated, but do NOT add ice or any other liquid to this solution. Shake well before drinking.   Please follow the written instructions below on the day of your exam:   1) Do not eat anything after 11:45 am (4 hours prior to your test)   2) Drink 1 bottle of contrast @ 1:45 pm (2 hours prior to your exam)  Remember to shake well before drinking and do NOT pour over ice.     Drink 1 bottle of contrast @ 2:45 pm (1 hour prior to your exam)   You may take any medications as prescribed with a small amount of water, if necessary. If you take any of the following medications: METFORMIN, GLUCOPHAGE, GLUCOVANCE, AVANDAMET, RIOMET, FORTAMET, [place] MET, JANUMET, GLUMETZA or METAGLIP, you MAY be asked to HOLD this medication 48 hours AFTER the exam.   The purpose of you drinking the oral contrast is to aid in the visualization of your intestinal tract. The contrast solution may cause some diarrhea. Depending on your  individual set of symptoms, you may also receive an intravenous injection of x-ray contrast/dye. Plan on being at [facility] for 45 minutes or longer, depending on the type of exam you are having performed.   If you have any questions regarding your exam or if you need to reschedule, you may call [facility] at [phone] between the hours of 8:00 am and 5:00 pm, Monday-Friday.   START Dicyclomine 10 mg 1 tablet as needed for abdominal pain.  Push fluids and a bland diet.  Go to the ED if you develop severe abdominal pain.

## 2020-02-08 NOTE — Progress Notes (Signed)
Agree with assessment and plan as outlined.  

## 2020-02-09 ENCOUNTER — Other Ambulatory Visit (HOSPITAL_COMMUNITY)
Admission: RE | Admit: 2020-02-09 | Discharge: 2020-02-09 | Disposition: A | Discharge status: Home / Self Care | Payer: Medicare Other | Source: Ambulatory Visit

## 2020-02-09 ENCOUNTER — Ambulatory Visit (HOSPITAL_COMMUNITY)
Admission: RE | Admit: 2020-02-09 | Discharge: 2020-02-09 | Disposition: A | Discharge status: Home / Self Care | Payer: Medicare Other | Source: Ambulatory Visit | Attending: Nurse Practitioner | Admitting: Nurse Practitioner

## 2020-02-09 ENCOUNTER — Other Ambulatory Visit: Payer: Self-pay

## 2020-02-09 DIAGNOSIS — R1032 Left lower quadrant pain: Secondary | ICD-10-CM | POA: Diagnosis present

## 2020-02-09 DIAGNOSIS — K5730 Diverticulosis of large intestine without perforation or abscess without bleeding: Secondary | ICD-10-CM | POA: Insufficient documentation

## 2020-02-09 DIAGNOSIS — Z8719 Personal history of other diseases of the digestive system: Secondary | ICD-10-CM | POA: Insufficient documentation

## 2020-02-09 DIAGNOSIS — I2510 Atherosclerotic heart disease of native coronary artery without angina pectoris: Secondary | ICD-10-CM | POA: Insufficient documentation

## 2020-02-09 DIAGNOSIS — R197 Diarrhea, unspecified: Secondary | ICD-10-CM | POA: Diagnosis present

## 2020-02-09 DIAGNOSIS — Q614 Renal dysplasia: Secondary | ICD-10-CM | POA: Insufficient documentation

## 2020-02-09 DIAGNOSIS — I700 Atherosclerosis of aorta: Secondary | ICD-10-CM | POA: Diagnosis not present

## 2020-02-09 IMAGING — CT CT ABD-PELV W/O CM
2 of 4 series · 16 of 46 positions shown, 18 images · non-contrast
Comparison: None.

CLINICAL DATA: Left lower quadrant abdominal pain.  Diarrhea.

EXAM:
CT ABDOMEN AND PELVIS WITHOUT CONTRAST
TECHNIQUE: Multidetector CT imaging of the abdomen and pelvis was performed
following the standard protocol without IV contrast.

[Series 2: axial st · axial · 0.90mm/px · z∈[-505,-125]mm · 13 of 86 slices shown, 15 images]
[im 5/86  soft-tissue]
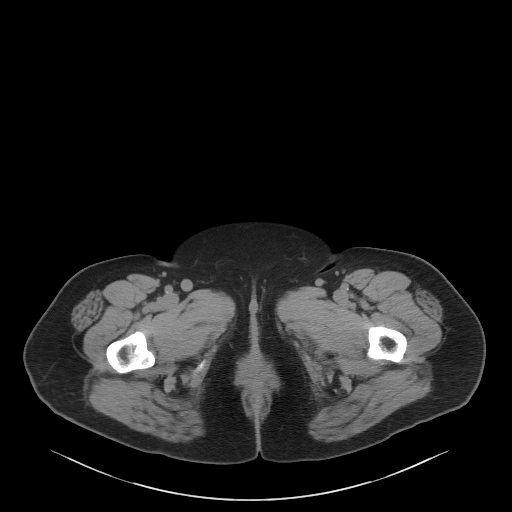
[im 5/86  bone]
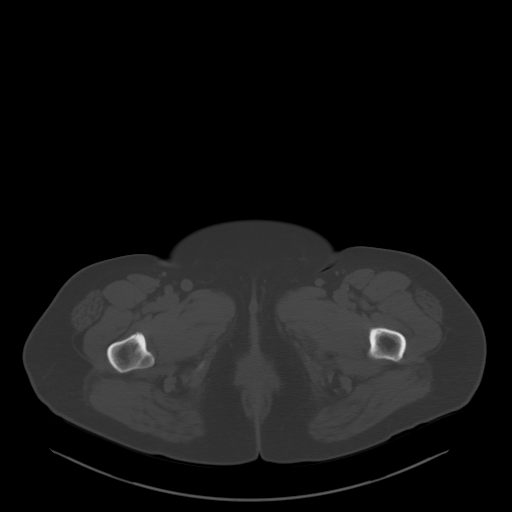
[im 10/86  soft-tissue]
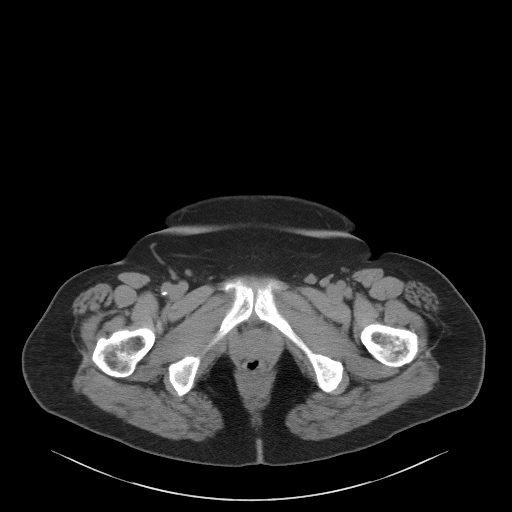
[im 19/86  soft-tissue]
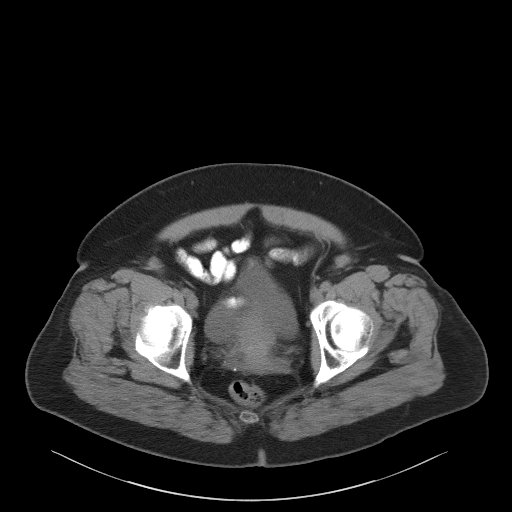
[im 24/86  soft-tissue]
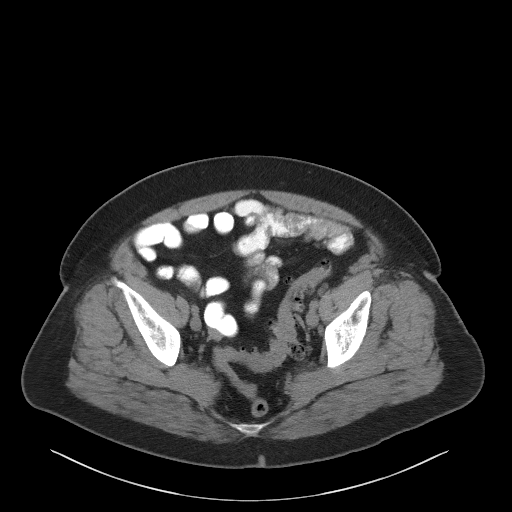
[im 29/86  soft-tissue]
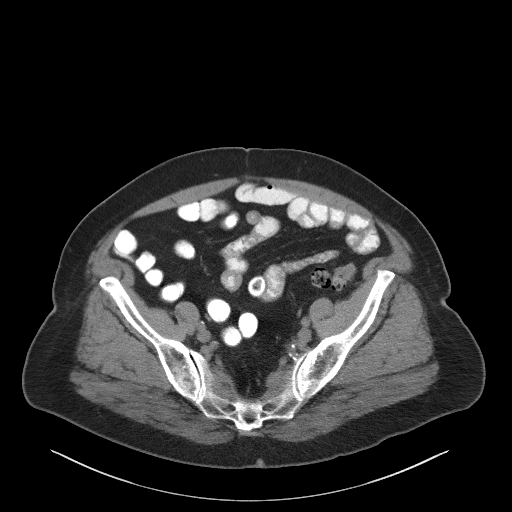
[im 38/86  soft-tissue]
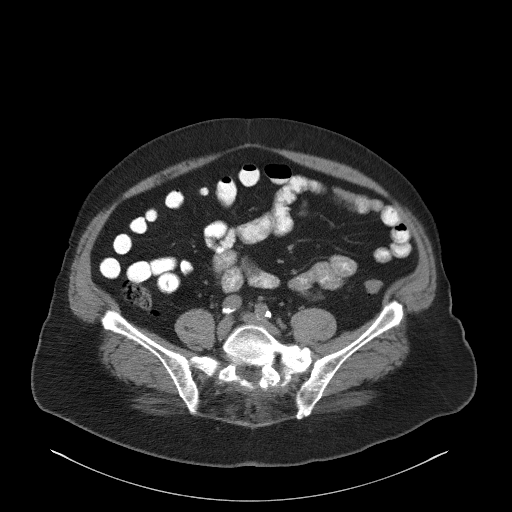
[im 43/86  soft-tissue]
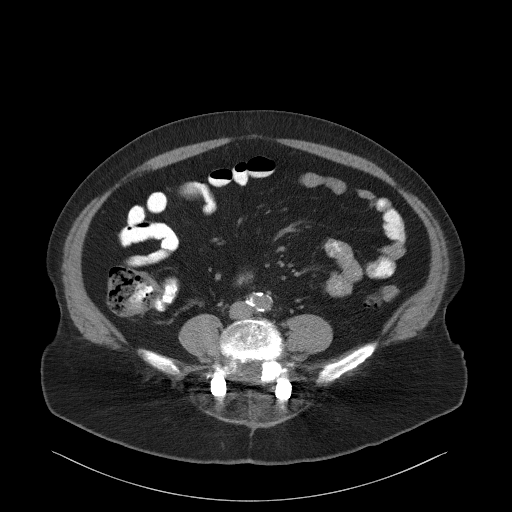
[im 48/86  soft-tissue]
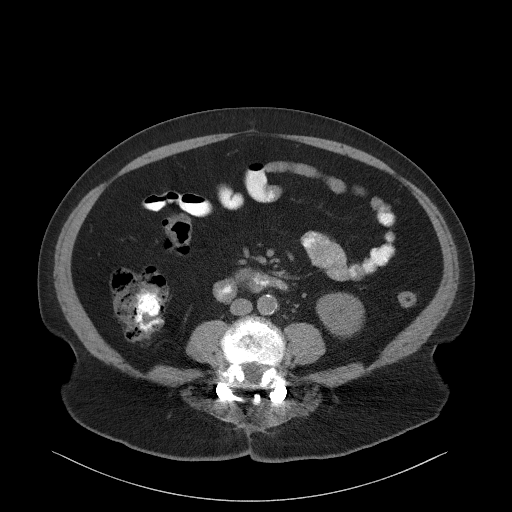
[im 57/86  soft-tissue]
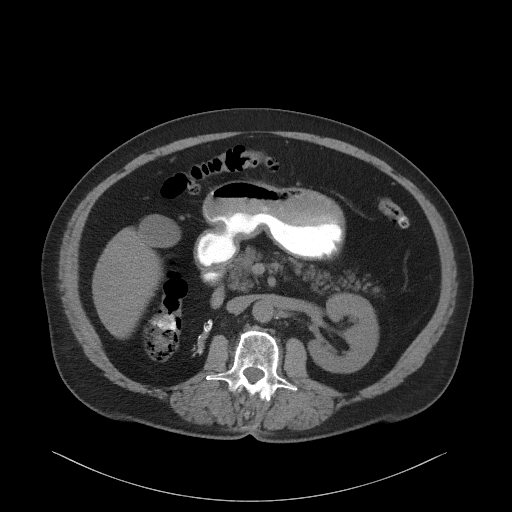
[im 57/86  bone]
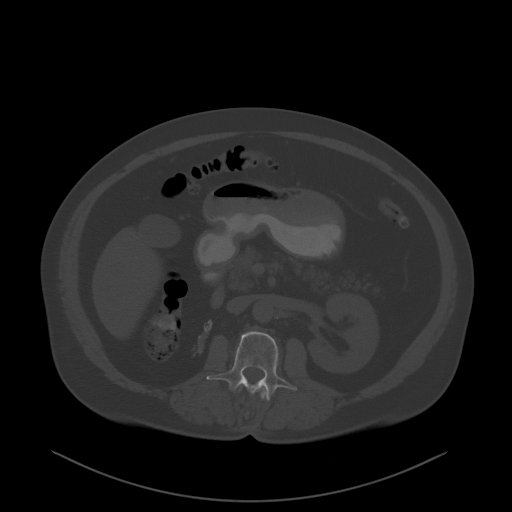
[im 62/86  soft-tissue]
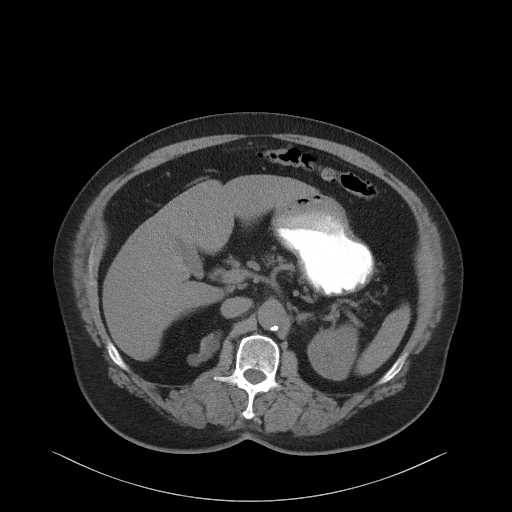
[im 67/86  soft-tissue]
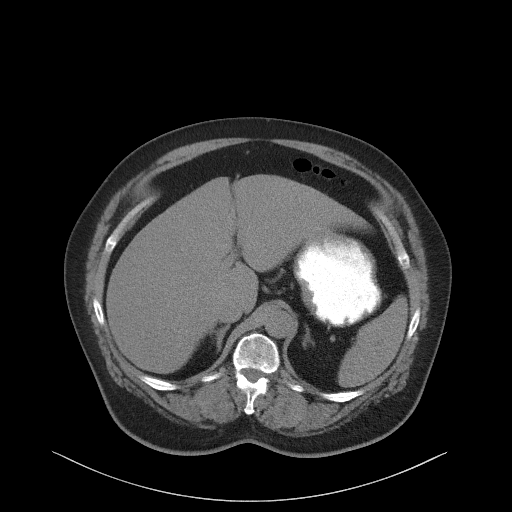
[im 76/86  soft-tissue]
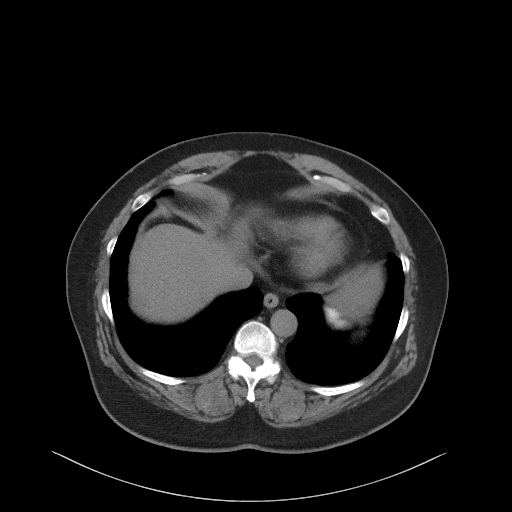
[im 81/86  soft-tissue]
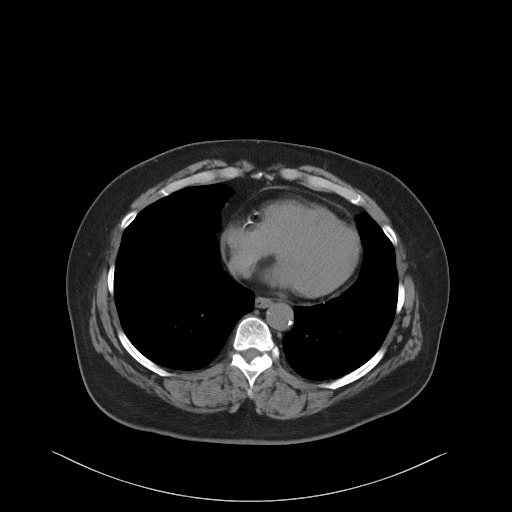

[Series 5: coronal st · coronal · 0.81mm/px · 3 of 114 slices shown]
[im 38/114  soft-tissue]
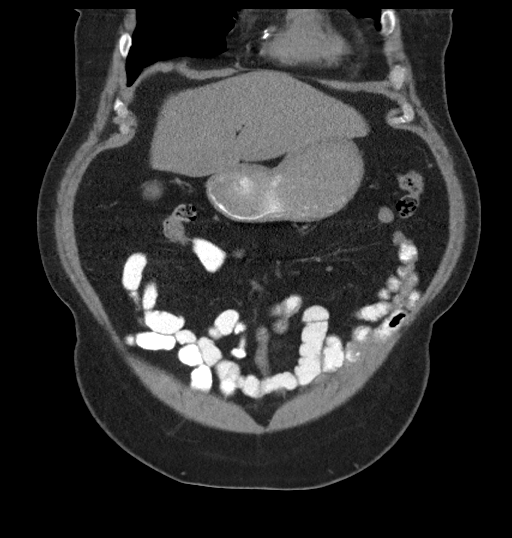
[im 51/114  soft-tissue]
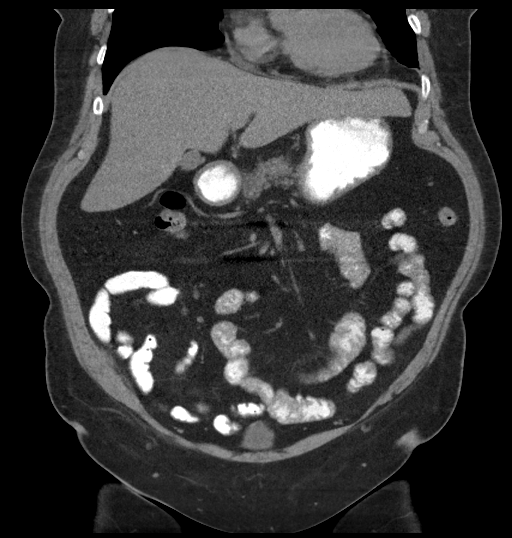
[im 63/114  soft-tissue]
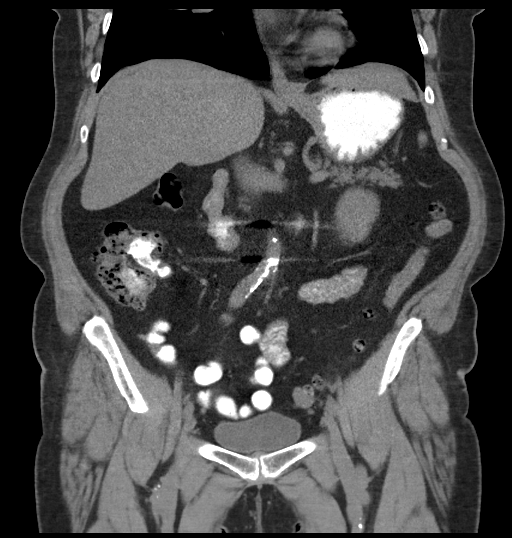

[16 of 46 positions shown; findings below may reference images not displayed]

FINDINGS: Lower chest: Right coronary artery atherosclerotic calcification.
Descending thoracic aortic atherosclerosis.

Hepatobiliary: Unremarkable

Pancreas: Unremarkable

Spleen: Unremarkable

Adrenals/Urinary Tract: Both adrenal glands appear normal. In the
expected location of the right kidney, there is a collection of
cystic lesions, calcifications, and bandlike density probably
representing multi cystic dysplastic kidney. No functional right
renal tissue. Compensatory hypertrophy of the left kidney noted.
Urinary bladder unremarkable.

Stomach/Bowel: Sigmoid colon diverticulosis noted with scattered
diverticula in the rest of the colon no current active
diverticulitis. Normal appendix. No bowel dilatation.

Vascular/Lymphatic: Aortoiliac atherosclerotic vascular disease.

Reproductive: Unremarkable

Other: No supplemental non-categorized findings.

Musculoskeletal: Posterior decompression with posterolateral rod and
pedicle screw fixation at L3-L4-L5. There is foraminal impingement
at each of these levels primarily from spurring.
IMPRESSION: 1. A specific cause for the patient's left lower quadrant abdominal
pain is not identified.
2. Nonfunctional right kidney (severely dysplastic) with
compensatory hypertrophy of the left kidney.
3. Other imaging findings of potential clinical significance:
Coronary atherosclerosis. Sigmoid colon diverticulosis. Posterior
decompression with posterolateral rod and pedicle screw fixation at
L3-L4-L5-L5. Foraminal impingement at each of these levels primarily
from spurring.
4. Aortic atherosclerosis.

Aortic Atherosclerosis ([BY]-[BY]).

## 2020-02-10 LAB — GASTROINTESTINAL PANEL BY PCR, STOOL (REPLACES STOOL CULTURE)

## 2020-02-12 ENCOUNTER — Telehealth: Payer: Self-pay | Admitting: General Surgery

## 2020-02-12 NOTE — Telephone Encounter (Signed)
Tried to contact the patient regarding her negative stool test. Her phone rand twice and disconnected. Sent the patient a letter notifying her of negative stool test.

## 2020-02-12 NOTE — Telephone Encounter (Signed)
-----   Message from Noralyn Pick, NP sent at 02/12/2020  7:30 AM EDT ----- Angelica Carter, pls inform the patient her GI pathogen stool test was negative. Thx.

## 2020-02-15 ENCOUNTER — Other Ambulatory Visit: Payer: Self-pay

## 2020-02-15 ENCOUNTER — Telehealth: Payer: Self-pay | Admitting: Nurse Practitioner

## 2020-02-15 DIAGNOSIS — R1032 Left lower quadrant pain: Secondary | ICD-10-CM

## 2020-02-15 DIAGNOSIS — R945 Abnormal results of liver function studies: Secondary | ICD-10-CM

## 2020-02-15 MED ORDER — DICYCLOMINE HCL 20 MG PO TABS: 20.0000 mg | ORAL_TABLET | Freq: Three times a day (TID) | ORAL | 1 refills | Status: DC | PRN

## 2020-02-15 NOTE — Telephone Encounter (Signed)
Pt called inquiring about imaging results.  

## 2020-02-15 NOTE — Telephone Encounter (Signed)
Pt called inquiring about imaging and lab results.

## 2020-03-08 ENCOUNTER — Ambulatory Visit: Payer: Medicare Other | Admitting: Neurology

## 2020-03-14 ENCOUNTER — Ambulatory Visit: Payer: Medicare Other | Admitting: Neurology

## 2020-03-14 ENCOUNTER — Other Ambulatory Visit: Payer: Self-pay

## 2020-03-14 ENCOUNTER — Other Ambulatory Visit (INDEPENDENT_AMBULATORY_CARE_PROVIDER_SITE_OTHER): Payer: Medicare Other

## 2020-03-14 ENCOUNTER — Encounter: Payer: Self-pay | Admitting: Neurology

## 2020-03-14 VITALS — BP 161/105 | HR 96 | Resp 20 | Ht 59.0 in | Wt 178.0 lb

## 2020-03-14 DIAGNOSIS — R413 Other amnesia: Secondary | ICD-10-CM

## 2020-03-14 DIAGNOSIS — R29898 Other symptoms and signs involving the musculoskeletal system: Secondary | ICD-10-CM

## 2020-03-14 DIAGNOSIS — R55 Syncope and collapse: Secondary | ICD-10-CM

## 2020-03-14 LAB — TSH: TSH: 0.85 u[IU]/mL (ref 0.35–4.50)

## 2020-03-14 LAB — VITAMIN B12: Vitamin B-12: 540 pg/mL (ref 211–911)

## 2020-03-14 NOTE — Patient Instructions (Signed)
1.  Will check neurocognitive testing 2.  Will check carotid duplex, bilateral 3.  Will check EEG 4.  Will check B12 and TSH 5.  Follow up after testing.

## 2020-03-14 NOTE — Progress Notes (Signed)
NEUROLOGY CONSULTATION NOTE  Angelica Carter MRN: [id number] DOB: 01/21/1956  Referring provider: Buzzy Han, MD Primary care provider: Kriste Basque, MD  Reason for consult:  Speech difficulty, memory problems   Subjective:  Angelica Carter is a 64 year old female with COPD, lumbar spondylosis s/p 4 surgeries, kidney disease, anxiety and history of TIA who presents for speech difficulty and memory deficits.  Symptoms started many years ago.  She reports short-term memory problems.  She has word-finding difficulty or trouble getting words out.  She misplaces objects such as her keys in the house.  She may forget why she walked in a room.  It happens quite frequently.  She also reports frequent falls.  When she stands up, she feels weak in the arms and legs.  Often she needs to sit back down to rest and try again.  When she stands, her knees start to buckle.  Because her feet feel weak, she has trouble keeping her foot on the pedal when driving.  She has chronic low back pain and nerve damage in her feet related to longstanding history of degenerative lumbar disease status post 4 surgeries with residual left leg pain and foot drop.  She has had episodes of syncope.  She had a cardiac workup earlier this year, including echocardiogram and event monitor, which were unremarkable.  She had an MRI of the brain without contrast on 04/18/2019 which was personally reviewed and demonstrated moderate chronic small vessel ischemic changes but otherwise no acute abnormality.  Several years ago, she was evaluated by neurologist while living in [place] who treated her for depression and anxiety.  PAST MEDICAL HISTORY: Past Medical History:  Diagnosis Date  . Allergy   . Anxiety   . Arthritis   . Asthma   . COPD (chronic obstructive pulmonary disease) ([place])   . 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 Depression   . Kidney disease    only has 1 kidney   . Left foot drop  07/14/2017  . Low back pain 07/14/2017  . Sleep apnea   . Stroke [facility]) 2009   mini stroke  . Tuberculosis     PAST SURGICAL HISTORY: Past Surgical History:  Procedure Laterality Date  . BACK SURGERY     x 4   . TUBAL LIGATION      MEDICATIONS: Current Outpatient Medications on File Prior to Visit  Medication Sig Dispense Refill  . amLODipine (NORVASC) 10 MG tablet Take 10 mg by mouth daily.    Marland Kitchen buPROPion (WELLBUTRIN XL) 150 MG 24 hr tablet Take 150 mg by mouth daily.    Marland Kitchen dicyclomine (BENTYL) 20 MG tablet Take 1 tablet (20 mg total) by mouth 3 (three) times daily as needed for spasms. 90 tablet 1  . ondansetron (ZOFRAN) 4 MG tablet Take 4 mg by mouth 3 (three) times daily as needed.    . pantoprazole (PROTONIX) 40 MG tablet Take 40 mg by mouth daily.    . pregabalin (LYRICA) 200 MG capsule Take 200 mg by mouth 3 (three) times daily.    Orlie Dakin SODIUM PO Take by mouth.    . SUMAtriptan (IMITREX) 25 MG tablet Take 25 mg by mouth every 2 (two) hours as needed for migraine. May repeat in 2 hours if headache persists or recurs.    . SYMBICORT 80-4.5 MCG/ACT inhaler Inhale 1 puff into the lungs 2 (two) times daily.    . traZODone (DESYREL) 50 MG tablet Take 75 mg by mouth at  bedtime.     No current facility-administered medications on file prior to visit.    ALLERGIES: Allergies  Allergen Reactions  . Cyclobenzaprine Other (See Comments)    ADVERSE REACTION    FAMILY HISTORY: Family History  Problem Relation Age of Onset  . Colon cancer Mother 34  . Heart disease Father   . Colon polyps Sister   . Stomach cancer Neg Hx   . Pancreatic cancer Neg Hx   . Esophageal cancer Neg Hx    SOCIAL HISTORY: Social History   Socioeconomic History  . Marital status: Married    Spouse name: Not on file  . Number of children: 2  . Years of education: 35  . Highest education level: Not on file  Occupational History  . Not on file  Tobacco Use  . Smoking status:  Light Tobacco Smoker    Packs/day: 0.25  . Smokeless tobacco: Never Used  Vaping Use  . Vaping Use: Never used  Substance and Sexual Activity  . Alcohol use: Never  . Drug use: Never  . Sexual activity: Not on file  Other Topics Concern  . Not on file  Social History Narrative   Left handed   Drinks caffeine    One story apartment   Social Determinants of Health   Financial Resource Strain:   . Difficulty of Paying Living Expenses: Not on file  Food Insecurity:   . Worried About Charity fundraiser in the Last Year: Not on file  . Ran Out of Food in the Last Year: Not on file  Transportation Needs:   . Lack of Transportation (Medical): Not on file  . Lack of Transportation (Non-Medical): Not on file  Physical Activity:   . Days of Exercise per Week: Not on file  . Minutes of Exercise per Session: Not on file  Stress:   . Feeling of Stress : Not on file  Social Connections:   . Frequency of Communication with Friends and Family: Not on file  . Frequency of Social Gatherings with Friends and Family: Not on file  . Attends Religious Services: Not on file  . Active Member of Clubs or Organizations: Not on file  . Attends Archivist Meetings: Not on file  . Marital Status: Not on file  Intimate Partner Violence:   . Fear of Current or Ex-Partner: Not on file  . Emotionally Abused: Not on file  . Physically Abused: Not on file  . Sexually Abused: Not on file    Objective:  Blood pressure (!) 161/105, pulse 96, resp. rate 20, height 4\' 11"  (1.499 m), weight 178 lb (80.7 kg), SpO2 100 %. General: No acute distress.  Patient appears well-groomed.   Head:  Normocephalic/atraumatic Eyes:  fundi examined but not visualized Neck: supple, no paraspinal tenderness, full range of motion Back: No paraspinal tenderness Heart: regular rate and rhythm Lungs: Clear to auscultation bilaterally. Vascular: No carotid bruits. Neurological Exam: Mental status:  [place]  [employer] Mental Exam 03/14/2020  Weekday Correct 1  Current year 1  What state are we in? 1  Amount spent 1  Amount left 2  # of Animals 2  5 objects recall 4  Number series 2  Hour markers 2  Time correct 2  Placed X in triangle correctly 1  Largest Figure 1  Name of female 2  Date back to work 2  Type of work 0  State she lived in 0  Total score 24   Cranial nerves:  CN I: not tested CN II: pupils equal, round and reactive to light, visual fields intact CN III, IV, VI:  full range of motion, no nystagmus, no ptosis CN V: facial sensation intact. CN VII: upper and lower face symmetric CN VIII: hearing intact CN IX, X: gag intact, uvula midline CN XI: sternocleidomastoid and trapezius muscles intact CN XII: tongue midline Bulk & Tone: normal, no fasciculations. Motor:  muscle strength 5/5 in upper extremities, 5/5 in bilateral hip flexion and knee flexion/extension with some give-way weakness, 5-5 right ankle dorsifexion/plantarflexion and EHL, 4+/5 left ankle dorsiflexion/plantarflexion and EHL. Sensation:  Pinprick and vibratory sensation reduced in both feet. Deep Tendon Reflexes:  absent throughout,  toes downgoing.   Finger to nose testing:  Without dysmetria.   Heel to shin:  Without dysmetria.   Gait:  Wide-based antalgic gait.  Romberg with sway.  Assessment/Plan:   1.  Memory deficits 2.  Syncope 3.  Unsteady gait/bilateral leg weakness/falls.  I think symptoms are residual from prior lumbar stenosis and surgeries.  1.  For workup of memory deficits:  B12, TSH, neuropsychological testing 2.  For workup of syncope:  Carotid duplex, EEG 3.  Follow up after testing.    Thank you for allowing me to take part in the care of this patient.  Metta Clines, DO  CC: Buzzy Han, MD

## 2020-03-18 NOTE — Progress Notes (Signed)
Pt advised of her lab results.

## 2020-03-25 ENCOUNTER — Ambulatory Visit (INDEPENDENT_AMBULATORY_CARE_PROVIDER_SITE_OTHER): Payer: Medicare Other | Admitting: Neurology

## 2020-03-25 ENCOUNTER — Other Ambulatory Visit: Payer: Self-pay

## 2020-03-25 DIAGNOSIS — R55 Syncope and collapse: Secondary | ICD-10-CM | POA: Diagnosis not present

## 2020-03-26 ENCOUNTER — Telehealth: Payer: Self-pay

## 2020-03-26 NOTE — Telephone Encounter (Signed)
-----   Message from Pieter Partridge, DO sent at 03/26/2020  7:08 AM EST ----- EEG is normal

## 2020-03-26 NOTE — Telephone Encounter (Signed)
Called patient and informed her of normal EEG results. Patient had no questions or concerns.

## 2020-03-26 NOTE — Procedures (Signed)
ELECTROENCEPHALOGRAM REPORT  Date of Study: 03/25/2020  Patient's Name: Angelica Carter MRN: [id number] Date of Birth: Jul 07, 1955   Clinical History: 64 year old female with memory problems, word-finding and verbal output difficulty, frequent falls and syncope.  Medications: NORVASC 10 MG tablet WELLBUTRIN XL 150 MG 24 hr tablet BENTYL 20 MG tablet ZOFRAN 4 MG tablet PROTONIX 40 MG tablet LYRICA 200 MG capsule SENNOSIDES-DOCUSATE SODIUM PO IMITREX 25 MG tablet SYMBICORT 80-4.5 MCG/ACT inhaler DESYREL 50 MG tablet  Technical Summary: A multichannel digital EEG recording measured by the international 10-20 system with electrodes applied with paste and impedances below 5000 ohms performed in our laboratory with EKG monitoring in an awake and drowsy patient.  Hyperventilation not performed due to patient wearing face mask for COVID-19 pandemic.  Photic stimulation was performed.  The digital EEG was referentially recorded, reformatted, and digitally filtered in a variety of bipolar and referential montages for optimal display.    Description: The patient is awake and drowsy during the recording.  During maximal wakefulness, there is a symmetric, medium voltage 9 Hz posterior dominant rhythm that attenuates with eye opening.  The record is symmetric.  During drowsiness, there is an increase in theta slowing of the background.  Stage 2 sleep was not seen.  Photic stimulation did not elicit any abnormalities.  There were no epileptiform discharges or electrographic seizures seen.    EKG lead was unremarkable.  Impression: This awake and drowsy EEG is normal.    Clinical Correlation: A normal EEG does not exclude a clinical diagnosis of epilepsy.  If further clinical questions remain, prolonged EEG may be helpful.  Clinical correlation is advised.   Metta Clines, DO

## 2020-04-01 ENCOUNTER — Ambulatory Visit: Payer: Medicare Other | Admitting: Neurology

## 2020-04-02 ENCOUNTER — Ambulatory Visit: Payer: Medicare Other | Admitting: Gastroenterology

## 2020-04-02 ENCOUNTER — Encounter: Payer: Self-pay | Admitting: Gastroenterology

## 2020-04-02 ENCOUNTER — Other Ambulatory Visit (INDEPENDENT_AMBULATORY_CARE_PROVIDER_SITE_OTHER): Payer: Medicare Other

## 2020-04-02 VITALS — BP 140/90 | HR 83 | Ht 59.0 in | Wt 174.8 lb

## 2020-04-02 DIAGNOSIS — R7401 Elevation of levels of liver transaminase levels: Secondary | ICD-10-CM

## 2020-04-02 DIAGNOSIS — R1032 Left lower quadrant pain: Secondary | ICD-10-CM | POA: Diagnosis not present

## 2020-04-02 DIAGNOSIS — Z86010 Personal history of colonic polyps: Secondary | ICD-10-CM | POA: Diagnosis not present

## 2020-04-02 DIAGNOSIS — R102 Pelvic and perineal pain: Secondary | ICD-10-CM

## 2020-04-02 LAB — HEPATIC FUNCTION PANEL
ALT: 50 U/L — ABNORMAL HIGH (ref 0–35)
AST: 29 U/L (ref 0–37)
Albumin: 4.4 g/dL (ref 3.5–5.2)
Alkaline Phosphatase: 70 U/L (ref 39–117)
Bilirubin, Direct: 0.1 mg/dL (ref 0.0–0.3)
Total Bilirubin: 0.4 mg/dL (ref 0.2–1.2)
Total Protein: 7.7 g/dL (ref 6.0–8.3)

## 2020-04-02 MED ORDER — DICYCLOMINE HCL 20 MG PO TABS: 20.0000 mg | ORAL_TABLET | Freq: Three times a day (TID) | ORAL | 1 refills | Status: DC | PRN

## 2020-04-02 NOTE — Patient Instructions (Addendum)
If you are age 65 or older, your body mass index should be between 23-30. Your Body mass index is 35.31 kg/m. If this is out of the aforementioned range listed, please consider follow up with your Primary Care Provider.  If you are age 62 or younger, your body mass index should be between 19-25. Your Body mass index is 35.31 kg/m. If this is out of the aformentioned range listed, please consider follow up with your Primary Care Provider.   We have refilled your Bentyl.  Your provider has requested that you go to the basement level for lab work before leaving today. Press "B" on the elevator. The lab is located at the first door on the left as you exit the elevator.  Please follow up with Gynecology and Pain Management.  It was great seeing you today!  Thank you for entrusting me with your care and choosing [facility].  Dr. Havery Moros

## 2020-04-02 NOTE — Progress Notes (Signed)
HPI :  64 year old female with a history of CVA, asthma, COPD, OSA, diverticulitis, colon polyps, here for follow-up visit for abdominal pain.  She was previously seen in October by Carl Best for abdominal pain and bowel changes.  She had labs done which showed an ALT of 71, AST of 44, otherwise normal.  Brand blood cell count normal.  CRP normal.  GI pathogen panel negative.  She underwent a CT scan of her abdomen pelvis which did not show any concerning cause for abdominal pain.  Of note she has posterior decompression with hardware in place and foraminal impingement at each of those levels from spurring.  She states she continues to have pain in her lower abdomen.  When asked her to point to this it appears it is in her left pelvic area to very lower abdomen area.  It is tender to the touch.  She can have pain therefore 1 to 2 days at a time and then will go away for several days.  She does have severity and fluctuations of pain.  When she was last seen in October it was rated 10 out of 10 and that is the worst it can get, at baseline she typically has a mild annoyance at 3 out of 10 pain.  She has been using heat and ice on it which can sometimes help.  Severity really does fluctuate.  There is no clear triggers to this.  Bowel movements do not relieve the pain.  She has a softer to loose stool perhaps once a day to twice a day at most but this is been better than previous.  She has tried some dicyclomine which she thinks may potentially help somewhat.  She is not seen a gynecologist recently.  She denies any positional component.  She denies any NSAID use.  She does have ongoing severe lower back pain which bothers her.  She is unaware of elevation in her liver enzymes, she did not follow-up for prior lab work.  She denies any history of liver problems.  Colonoscopy 08/25/2019: - The examined portion of the ileum was normal. - One 4 mm polyp in the ascending colon, removed - One 4 mm  polyp in the ascending colon, removed with a cold snare. Resected and retrieved. - One 3 mm polyp at the hepatic flexure, removed with a cold biopsy forceps. Resected and retrieved. - Three 4 to 10 mm polyps in the transverse colon, removed with a cold snare. Resected and retrieved. - Two 4 to 6 mm polyps in the sigmoid colon, removed with a cold snare. Resected and retrieved. - Diverticulosis in the entire examined colon. - The examination was otherwise normal. - Biopsies were taken with a cold forceps from the right colon, left colon and transverse colon for evaluation of microscopic colitis. - 3 year recall colonoscopy  1. Surgical [P], colon, sigmoid, transverse, ascending, hepatic flexure, polyp (7) - MULTIPLE FRAGMENTS OF TUBULAR ADENOMA(S) - NO HIGH GRADE DYSPLASIA OR MALIGNANCY IDENTIFIED 2. Surgical [P], right colon, transverse, left colon - BENIGN COLONIC MUCOSA - NO ACTIVE INFLAMMATION OR EVIDENCE OF MICROSCOPIC COLITIS - NO HIGH GRADE DYSPLASIA OR MALIGNANCY IDENTIFIED  CT scan 02/09/20 -  IMPRESSION: 1. A specific cause for the patient's left lower quadrant abdominal pain is not identified. 2. Nonfunctional right kidney (severely dysplastic) with compensatory hypertrophy of the left kidney. 3. Other imaging findings of potential clinical significance: Coronary atherosclerosis. Sigmoid colon diverticulosis. Posterior decompression with posterolateral rod and pedicle screw fixation at  L3-L4-L5-L5. Foraminal impingement at each of these levels primarily from spurring. 4. Aortic atherosclerosis.  Aortic Atherosclerosis (ICD10-I70.0).   Labs 10/14 - GI path panel negative, ALT 71, AST 44, CBC normal,     Past Medical History:  Diagnosis Date  . Allergy   . Anxiety   . Arthritis   . Asthma   . COPD (chronic obstructive pulmonary disease) ([place])   . 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 Depression   . Kidney disease    only has 1 kidney   . Left  foot drop 07/14/2017  . Low back pain 07/14/2017  . Sleep apnea   . Stroke [facility]) 2009   mini stroke  . Tuberculosis      Past Surgical History:  Procedure Laterality Date  . BACK SURGERY     x 4   . TUBAL LIGATION     Family History  Problem Relation Age of Onset  . Colon cancer Mother 20  . Heart disease Father   . Colon polyps Sister   . Stomach cancer Neg Hx   . Pancreatic cancer Neg Hx   . Esophageal cancer Neg Hx    Social History   Tobacco Use  . Smoking status: Light Tobacco Smoker    Packs/day: 0.25  . Smokeless tobacco: Never Used  Vaping Use  . Vaping Use: Never used  Substance Use Topics  . Alcohol use: Never  . Drug use: Never   Current Outpatient Medications  Medication Sig Dispense Refill  . amLODipine (NORVASC) 10 MG tablet Take 10 mg by mouth daily.    Marland Kitchen buPROPion (WELLBUTRIN XL) 150 MG 24 hr tablet Take 150 mg by mouth daily.    Marland Kitchen dicyclomine (BENTYL) 20 MG tablet Take 1 tablet (20 mg total) by mouth 3 (three) times daily as needed for spasms. 90 tablet 1  . ondansetron (ZOFRAN) 4 MG tablet Take 4 mg by mouth 3 (three) times daily as needed.    . pantoprazole (PROTONIX) 40 MG tablet Take 40 mg by mouth daily.    . pregabalin (LYRICA) 200 MG capsule Take 200 mg by mouth 3 (three) times daily.    Orlie Dakin SODIUM PO Take by mouth.    . SUMAtriptan (IMITREX) 25 MG tablet Take 25 mg by mouth every 2 (two) hours as needed for migraine. May repeat in 2 hours if headache persists or recurs.    . SYMBICORT 80-4.5 MCG/ACT inhaler Inhale 1 puff into the lungs 2 (two) times daily.    . traZODone (DESYREL) 50 MG tablet Take 75 mg by mouth at bedtime.     No current facility-administered medications for this visit.   Allergies  Allergen Reactions  . Cyclobenzaprine Other (See Comments)    ADVERSE REACTION     Review of Systems: All systems reviewed and negative except where noted in HPI.   Lab Results  Component Value Date   WBC 6.1  02/08/2020   HGB 14.5 02/08/2020   HCT 44.4 02/08/2020   MCV 85.5 02/08/2020   PLT 183.0 02/08/2020    Lab Results  Component Value Date   CREATININE 1.12 02/08/2020   BUN 15 02/08/2020   NA 138 02/08/2020   K 4.1 02/08/2020   CL 103 02/08/2020   CO2 27 02/08/2020    Lab Results  Component Value Date   ALT 50 (H) 04/02/2020   AST 29 04/02/2020   ALKPHOS 70 04/02/2020   BILITOT 0.4 04/02/2020     Physical Exam: BP 140/90 (  BP Location: Left Arm)   Pulse 83   Ht 4\' 11"  (1.499 m)   Wt 174 lb 12.8 oz (79.3 kg)   SpO2 98%   BMI 35.31 kg/m  Constitutional: Pleasant,well-developed, female in no acute distress. Abdominal: Soft, nondistended, focal tenderness to left anterior pelvic area to LLQ which reproduces her symptoms. There are no masses palpable. CMA Tia Alert as standby Extremities: no edema Lymphadenopathy: No cervical adenopathy noted. Neurological: Alert and oriented to person place and time. Skin: Skin is warm and dry. No rashes noted. Psychiatric: Normal mood and affect. Behavior is normal.   ASSESSMENT AND PLAN: 64 y/o female here for reassessment of following issues:  Abdominal / pelvic pain - persistent lower abdominal to pelvic pain.  On exam today this appears more pelvic in location and inferior to her left lower abdomen.  She has had a negative colonoscopy and CT scan.  Labs look okay.  No evidence of diverticulitis.  Given the location of her pain I do think she should see a gynecologist to make sure of having related to her uterus or ovaries. That being said, she has ongoing low back pain which is quite bothersome to her, wonder if this is related to her presentation at all and if this pain may more likely be musculoskeletal or neuropathic in etiology.  Her back pain is poorly controlled and I think that she should follow up with a pain management clinic to have better management of her back pain to see if this helps her abdomen / pelvic area.   Bentyl can  help at times, although this is unusual for bowel spasm to present like this, that being said if it helps she can continue it. She can follow up as needed for this issue  Elevated ALT - she has had a one time elevation of ALT noted on prior labs, will repeat to see if this is a chronic elevation and if so will need further evaluation for that.   History of colon polyps - due for surveillance exam in 3 years from her last exam  [place] Cellar, MD [facility]

## 2020-04-03 ENCOUNTER — Other Ambulatory Visit: Payer: Self-pay

## 2020-04-03 DIAGNOSIS — R7401 Elevation of levels of liver transaminase levels: Secondary | ICD-10-CM

## 2020-04-03 DIAGNOSIS — R945 Abnormal results of liver function studies: Secondary | ICD-10-CM

## 2020-04-04 ENCOUNTER — Other Ambulatory Visit: Payer: Self-pay

## 2020-04-04 ENCOUNTER — Ambulatory Visit (HOSPITAL_COMMUNITY)
Admission: RE | Admit: 2020-04-04 | Discharge: 2020-04-04 | Disposition: A | Discharge status: Home / Self Care | Payer: Medicare Other | Source: Ambulatory Visit | Attending: Cardiology | Admitting: Cardiology

## 2020-04-04 DIAGNOSIS — R55 Syncope and collapse: Secondary | ICD-10-CM | POA: Insufficient documentation

## 2020-04-05 NOTE — Progress Notes (Signed)
Pt advised of he Carotid ultrasound results.

## 2020-05-03 ENCOUNTER — Ambulatory Visit: Payer: Medicare Other | Admitting: Psychology

## 2020-05-03 ENCOUNTER — Other Ambulatory Visit (INDEPENDENT_AMBULATORY_CARE_PROVIDER_SITE_OTHER): Payer: Medicare Other

## 2020-05-03 ENCOUNTER — Encounter: Payer: Self-pay | Admitting: Psychology

## 2020-05-03 ENCOUNTER — Ambulatory Visit (INDEPENDENT_AMBULATORY_CARE_PROVIDER_SITE_OTHER): Payer: Medicare Other | Admitting: Psychology

## 2020-05-03 ENCOUNTER — Other Ambulatory Visit: Payer: Self-pay

## 2020-05-03 DIAGNOSIS — R41840 Attention and concentration deficit: Secondary | ICD-10-CM

## 2020-05-03 DIAGNOSIS — R945 Abnormal results of liver function studies: Secondary | ICD-10-CM | POA: Diagnosis not present

## 2020-05-03 DIAGNOSIS — F330 Major depressive disorder, recurrent, mild: Secondary | ICD-10-CM

## 2020-05-03 DIAGNOSIS — M797 Fibromyalgia: Secondary | ICD-10-CM

## 2020-05-03 DIAGNOSIS — F411 Generalized anxiety disorder: Secondary | ICD-10-CM

## 2020-05-03 DIAGNOSIS — G4733 Obstructive sleep apnea (adult) (pediatric): Secondary | ICD-10-CM

## 2020-05-03 DIAGNOSIS — F909 Attention-deficit hyperactivity disorder, unspecified type: Secondary | ICD-10-CM | POA: Insufficient documentation

## 2020-05-03 DIAGNOSIS — N189 Chronic kidney disease, unspecified: Secondary | ICD-10-CM | POA: Insufficient documentation

## 2020-05-03 DIAGNOSIS — G459 Transient cerebral ischemic attack, unspecified: Secondary | ICD-10-CM | POA: Insufficient documentation

## 2020-05-03 DIAGNOSIS — R7401 Elevation of levels of liver transaminase levels: Secondary | ICD-10-CM | POA: Diagnosis not present

## 2020-05-03 DIAGNOSIS — J449 Chronic obstructive pulmonary disease, unspecified: Secondary | ICD-10-CM | POA: Insufficient documentation

## 2020-05-03 DIAGNOSIS — Z87820 Personal history of traumatic brain injury: Secondary | ICD-10-CM | POA: Insufficient documentation

## 2020-05-03 DIAGNOSIS — F329 Major depressive disorder, single episode, unspecified: Secondary | ICD-10-CM | POA: Insufficient documentation

## 2020-05-03 DIAGNOSIS — I700 Atherosclerosis of aorta: Secondary | ICD-10-CM | POA: Insufficient documentation

## 2020-05-03 DIAGNOSIS — R4189 Other symptoms and signs involving cognitive functions and awareness: Secondary | ICD-10-CM

## 2020-05-03 LAB — IBC + FERRITIN
Ferritin: 140.4 ng/mL (ref 10.0–291.0)
Iron: 84 ug/dL (ref 42–145)
Saturation Ratios: 20.3 % (ref 20.0–50.0)
Transferrin: 296 mg/dL (ref 212.0–360.0)

## 2020-05-03 NOTE — Progress Notes (Signed)
NEUROPSYCHOLOGICAL EVALUATION [name] Department of Neurology  Date of Evaluation: May 03, 2020  Reason for Referral:   Angelica Carter is a 65 y.o. mixed-handed (left hand to eat and write, right hand for everything else) Caucasian female referred by Shon MilletAdam Jaffe, D.O., to characterize her current cognitive functioning and assist with diagnostic clarity and treatment planning in the context of subjective cognitive decline and several psychiatric and medical comorbidities.   Assessment and Plan:   Clinical Impression(s): Angelica Carter's pattern of performance is suggestive of neuropsychological functioning within normal limits. An isolated normative weakness was exhibited across a semantic fluency task. Mild performance variability was also exhibited across retrieval aspects of memory. However, despite this variability, retrieval aspects of memory were overall appropriate. Likewise, performance was appropriate across all other assessed cognitive domains. This included processing speed, attention/concentration, executive functioning, receptive language, phonemic fluency, confrontation naming, visuospatial abilities, and learning and memory. Angelica Carter denied difficulties completing instrumental activities of daily living (ADLs) independently.  The etiology of subjective day-to-day cognitive dysfunction is likely multifactorial in nature. She reported a history of ADHD diagnosed in childhood which is not currently treated. Specific to ADHD, the absence of cognitive deficits should not be interpreted as absence of this condition as individuals with ADHD can perform strongly in testing environments, likely due to the highly structured and distraction free setting in which testing commences. Across mood-related questionnaires, she reported acute symptoms of moderate anxiety and mild depression. She also reported currently untreated obstructive sleep apnea and symptoms of  chronic pain. Overall, it is certainly possible that subjective dysfunction is related to underlying attentional dysregulation, made worse by sleep dysfunction, psychiatric distress, and chronic pain symptoms. Neuroimaging suggested moderate small vessel ischemia, which could also worsen underlying attentional dysregulation. Based upon current testing, I have no present concerns surrounding an early-onset neurodegenerative condition.   Recommendations: Angelica Carter reported that she has not utilized her CPAP machine for the past 8-12 months due to it being broken and her previously being sent incorrect parts. I would strongly recommend that she reach out to her PCP/sleep specialist and work together to discover why this is occurring. It is very important that this condition is treated as it is likely directly responsible for ongoing cognitive dysfunction. Untreated or poorly managed sleep apnea will increase her risk for stroke, heart attack, and future cognitive decline.   A combination of medication and psychotherapy has been shown to be most effective at treating symptoms of anxiety and depression. She reported that she has been prescribed mood-related medications but has not taken them as she does not "trust" them. Angelica Carter is encouraged to speak with her prescribing physician regarding her concerns surrounding these medications so that trust concerns can be alleviated.  Likewise, Angelica Carter is encouraged to consider engaging in short-term psychotherapy to address symptoms of psychiatric distress. She would benefit from an active and collaborative therapeutic environment, rather than one purely supportive in nature. Recommended treatment modalities include Cognitive Behavioral Therapy (CBT) or Acceptance and Commitment Therapy (ACT).  Should mood, sleep, and pain symptoms become better managed and she continue to report cognitive and/or functional decline, a repeat evaluation would be warranted at that  time. The current evaluation will serve as an excellent baseline to compare future evaluations against.    Angelica Carter is encouraged to attend to lifestyle factors for brain health (e.g., regular physical exercise, good nutrition habits, regular participation in cognitively-stimulating activities, and general stress management techniques), which are  likely to have benefits for both emotional adjustment and cognition. In fact, in addition to promoting good general health, regular exercise incorporating aerobic activities (e.g., brisk walking, jogging, cycling, etc.) has been demonstrated to be a very effective treatment for depression and stress, with similar efficacy rates to both antidepressant medication and psychotherapy. Optimal control of vascular risk factors (including safe cardiovascular exercise and adherence to dietary recommendations) is encouraged. Likewise, continued compliance with her CPAP machine will also be important.  If interested, there are some activities which have therapeutic value and can be useful in keeping her cognitively stimulated. For suggestions, Angelica Carter is encouraged to go to the following website: https://www.[facility].org/get-to-know-barrow/centers-programs/neurorehabilitation-center/neuro-rehab-apps-and-games/ which has options, categorized by level of difficulty. It should be noted that these activities should not be viewed as a substitute for therapy.  When learning new information, she would benefit from information being broken up into small, manageable pieces. She may also find it helpful to articulate the material in her own words and in a context to promote encoding at the onset of a new task. This material may need to be repeated multiple times to promote encoding.  Memory can be improved using internal strategies such as rehearsal, repetition, chunking, mnemonics, association, and imagery. External strategies such as written notes in a consistently used memory  journal, visual and nonverbal auditory cues such as a calendar on the refrigerator or appointments with alarm, such as on a cell phone, can also help maximize recall.    To address problems with fluctuating attention, she may wish to consider:   -Avoiding external distractions when needing to concentrate   -Limiting exposure to fast paced environments with multiple sensory demands   -Writing down complicated information and using checklists   -Attempting and completing one task at a time (i.e., no multi-tasking)   -Verbalizing aloud each step of a task to maintain focus   -Reducing the amount of information considered at one time  Review of Records:   Angelica Carter was seen by [facility] Metta Clines, D.O.) on 03/14/2020 for an evaluation of memory and speech difficulties. Symptoms were said to have started many years prior. She primarily described difficulties with short-term memory and word finding. She misplaces objects such as her keys in her house and may forget why she walked into a room. She also reported frequent falls. When she stands up, she feels weak in the arms and legs and often needs to sit back down to rest before trying again. When she stands, her knees start to buckle. She has chronic low back pain and nerve damage in her feet related to longstanding history of degenerative lumbar disease s/p four surgeries with residual left leg pain and foot drop. She also has had episodes of syncope. A cardiac workup earlier in 2021, including echocardiogram and event monitor, was unremarkable. Performance on a brief cognitive screening instrument (SLUMS) was 24/30. Ultimately, Angelica Carter was referred for a comprehensive neuropsychological evaluation to characterize her cognitive abilities and to assist with diagnostic clarity and treatment planning.   Brain MRI on 04/18/2019 revealed moderate chronic small vessel disease but no acute intracranial abnormality. Awake and drowsy EEG on 03/25/2020  was normal and did not suggest epileptiform activity.   Past Medical History:  Diagnosis Date  . ADHD (attention deficit hyperactivity disorder)    diagnosed in childhood; unspecified type but most likely inattentive  . Allergy   . Aortic atherosclerosis   . Arthritis   . Asthma   . 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 COPD (chronic obstructive pulmonary disease)   . 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 Fibromyalgia   . Generalized anxiety disorder   . History of concussion   . Left foot drop 07/14/2017  . LLQ pain 02/08/2020  . Low back pain 07/14/2017  . Major depressive disorder   . Obstructive sleep apnea    Not currently using CPAP machine as machine is broken; awaiting parts  . TIA (transient ischemic attack) 2009  . Tuberculosis     Past Surgical History:  Procedure Laterality Date  . BACK SURGERY     x 4   . TUBAL LIGATION      Current Outpatient Medications:  .  amLODipine (NORVASC) 10 MG tablet, Take 10 mg by mouth daily., Disp: , Rfl:  .  buPROPion (WELLBUTRIN XL) 150 MG 24 hr tablet, Take 150 mg by mouth daily., Disp: , Rfl:  .  dicyclomine (BENTYL) 20 MG tablet, Take 1 tablet (20 mg total) by mouth 3 (three) times daily as needed for spasms., Disp: 90 tablet, Rfl: 1 .  DIFLUCAN 150 MG tablet, Take by mouth daily., Disp: , Rfl:  .  HYDROcodone-acetaminophen (NORCO/VICODIN) 5-325 MG tablet, Take 1 tablet by mouth 2 (two) times daily., Disp: , Rfl:  .  ondansetron (ZOFRAN) 4 MG tablet, Take 4 mg by mouth 3 (three) times daily as needed., Disp: , Rfl:  .  pantoprazole (PROTONIX) 40 MG tablet, Take 40 mg by mouth daily., Disp: , Rfl:  .  pregabalin (LYRICA) 200 MG capsule, Take 200 mg by mouth 3 (three) times daily., Disp: , Rfl:  .  SENNOSIDES-DOCUSATE SODIUM PO, Take by mouth., Disp: , Rfl:  .  SUMAtriptan (IMITREX) 25 MG tablet, Take 25 mg by mouth every 2 (two) hours as needed for migraine. May repeat in 2 hours if headache  persists or recurs., Disp: , Rfl:  .  SYMBICORT 80-4.5 MCG/ACT inhaler, Inhale 1 puff into the lungs 2 (two) times daily., Disp: , Rfl:  .  traZODone (DESYREL) 50 MG tablet, Take 75 mg by mouth at bedtime., Disp: , Rfl:   Clinical Interview:   The following information was obtained during a clinical interview with Angelica Carter and her husband prior to cognitive testing.  Cognitive Symptoms: Decreased short-term memory: Endorsed. She reported primary difficulties with slowed word recall, as well as trouble recalling the details of previous conversations or events. Difficulties were said to have been present for the past 2-3 years and have gradually worsened over time.  Decreased long-term memory: Endorsed. She reported missing "pieces" of important, more long-term events or experiences.   Decreased attention/concentration: Endorsed. She reported being diagnosed with ADHD during childhood. As such, deficits with sustained attention and organization are longstanding and have persisted to present day. She noted that these deficits have largely been stable over time. She denied current ADHD medication intervention.  Reduced processing speed: Endorsed. Difficulties with executive functions: Endorsed. She reported longstanding deficits with organization, likely relating back to her history of ADHD. She reported remote difficulties with impulsivity, but her and her husband stated that these had improved lately. She denied trouble with indecisiveness or making poor/unsafe decisions. Overt personality changes were denied.  Difficulties with emotion regulation: Denied. Difficulties with receptive language: Denied. Difficulties with word finding: Endorsed. She reported experiencing tip-of-the-tongue phenomenons frequently. Her husband also noted that she has been having sporadic slurred speech for the past 2-3 years. The etiology of this is unknown.  Decreased visuoperceptual  ability: Denied.  Difficulties  completing ADLs: Denied.  Additional Medical History:  History of traumatic brain injury/concussion: Endorsed. She reported a history of several concussive injuries throughout her life. Her most recent injury was sustained about 5-7 years ago as a result of a fall. She denied persisting cognitive symptoms stemming from this event.  History of stroke: Denied. However, she did endorse a previous transient ischemic attack (TIA) about 11-12 years ago. No similar events were said to have happened since.  History of seizure activity: Denied. History of known exposure to toxins: Denied. Symptoms of chronic pain: Endorsed. She reported chronic back pain and a history of four related back surgeries. She also reported nerve damage in her right leg, as well as a history of fibromyalgia.  Experience of frequent headaches/migraines: Endorsed. She reported headache symptoms occurring a couple times per week. These were generally said to occur in the back of her head. Her husband reminded her that she will sometimes reported blurred vision or other visual changes associated with these headaches. This was said to prominently occur while driving.  Frequent instances of dizziness/vertigo: Endorsed. She reported experiencing brief lightheaded symptoms upon standing quickly. She also described other instances where she feels "strange" and will experience lightheaded symptoms seemingly at random.   Sensory changes: She wears glasses with positive effect. Mild hearing loss was also noted in her left ear, attributed to her having a ruptured eardrum when she was younger. Other sensory changes/difficulties (e.g., taste or smell) were denied.  Balance/coordination difficulties: Endorsed. Ongoing balance instability was attributed to back pain, her history of four back surgeries, and nerve damage in her leg. Her husband further commented that she seems to veer to the left while walking and has unintentionally bumped into things in  her environment. No recent falls were reported. Her husband stated that balance difficulties improved significantly following the removal of muscle relaxant medications.  Other motor difficulties: Endorsed. She reported sporadic tremor symptoms primarily in her left hand. Her husband stated that these did not occur consistently.  Sleep History: Estimated hours obtained each night: 3-4.5 hours. Difficulties falling asleep: Endorsed. She reported having an overactive mind and that racing thoughts can sometimes prevent her from falling asleep. This was attributed to symptoms of ADHD. Trazodone was said to be helpful. She previously tried taking melatonin but stopped as the dosing required to experience any effects became too high.  Difficulties staying asleep: Endorsed. Given chronic back pain, she reported commonly tossing and turning throughout the night in order to try and get comfortable.  Feels rested and refreshed upon awakening: Denied.  History of snoring: Endorsed. History of waking up gasping for air: Endorsed. Witnessed breath cessation while asleep: Endorsed. She acknowledged a history of obstructive sleep apnea. She has not used her CPAP machine for the past 8-12 months due to it being broken and her consistently being sent incorrect parts. She was unsure who she needed to contact to correct these continuing errors.   History of vivid dreaming: Denied. Excessive movement while asleep: Denied outside of tossing and turning behaviors attributed to chronic pain symptoms.  Instances of acting out her dreams: Denied.  Psychiatric/Behavioral Health History: Depression: She reported a longstanding history of depression. She has been prescribed mood-related medications. However, she is not currently taking them as she "does not trust [them]." She clarified that since she only has one kidney and a history of kidney disease, she is often cautious about taking medications. She reported previously  working with a Teacher, music  and several different therapists. Her experiences with the latter were mixed. She described her current mood as "usually pretty good." Her husband expressed increased agitation, irritability, and frustration at times. Current or remote suicidal ideation, intent, or plan was denied.  Anxiety: She reported a longstanding history of generalized anxiety symptoms. These were said to generally coincide with depressive symptoms and follow a similar timeline/trajectory.  Mania: Denied. Trauma History: Denied. Visual/auditory hallucinations: Denied. Delusional thoughts: Denied.  Tobacco: Endorsed. She reported consuming 1.5 packs of cigarettes every two weeks.  Alcohol: She denied current alcohol consumption as well as a history of problematic alcohol abuse or dependence.  Recreational drugs: Endorsed. She reported marijuana use a couple times per week as a means to diminish anxiety, increase concentration/focus, and help her in falling asleep.  Caffeine: She reported consuming 1-2 cups of coffee in the morning.   Family History: Problem Relation Age of Onset  . Colon cancer Mother 39  . Heart disease Father   . Colon polyps Sister   . Stomach cancer Neg Hx   . Pancreatic cancer Neg Hx   . Esophageal cancer Neg Hx    This information was confirmed by Angelica Carter.  Academic/Vocational History: Highest level of educational attainment: 14 years. She graduated from high school and completed two additional years of college. She did not earn an Associate's degree and ultimately left college due to a number of life stressors and work/family responsibilities. She described herself as an average (A/B/C) student in academic settings. No relative weaknesses were identified.  History of developmental delay: Denied. History of grade repetition: Denied. Enrollment in special education courses: Denied. History of LD/ADHD: Denied.  Employment: She currently receives disability benefits  due to physical ailments, especially her numerous back surgeries and chronic pain. Prior to this, she worked as an Web designer.   Evaluation Results:   Behavioral Observations: Angelica Carter was accompanied by her husband, arrived to her appointment on time, and was appropriately dressed and groomed. She appeared alert and oriented. Observed gait and station were within normal limits. Gross motor functioning largely appeared intact upon informal observation. Tremors in her left hand were only observed after she held out her hand in an attempt to demonstrate them. Her affect was generally relaxed and positive, but did range appropriately given the subject being discussed during the clinical interview or the task at hand during testing procedures. Spontaneous speech was fluent and word finding difficulties were not observed during the clinical interview. Thought processes were coherent, organized, and normal in content. Insight into her cognitive difficulties appeared adequate. During testing, sustained attention was appropriate. Task engagement was adequate and she persisted when challenged. Overall, Angelica Carter was cooperative with the clinical interview and subsequent testing procedures.   Adequacy of Effort: The validity of neuropsychological testing is limited by the extent to which the individual being tested may be assumed to have exerted adequate effort during testing. Angelica Carter expressed her intention to perform to the best of her abilities and exhibited adequate task engagement and persistence. Scores across stand-alone and embedded performance validity measures were within expectation. As such, the results of the current evaluation are believed to be a valid representation of Angelica Carter's current cognitive functioning.  Test Results: Angelica Carter was fully oriented at the time of the current evaluation.  Intellectual abilities based upon educational and vocational attainment were estimated  to be in the average range. Premorbid abilities were estimated to be within the average range based upon a single-word reading  test.   Processing speed was average to well above average. Basic attention was above average. More complex attention (e.g., working memory) was also above average. Executive functioning was average to above average. She also performed in the well above average range on a task assessing safety and judgment.  Assessed receptive language abilities were above average. Likewise, Angelica Carter did not exhibit any difficulties comprehending task instructions and answered all questions asked of her appropriately. Assessed expressive language was somewhat variable. Phonemic fluency and confrontation were average, while semantic fluency was well below average.     Assessed visuospatial/visuoconstructional abilities were average to above average.    Learning (i.e., encoding) of novel verbal and visual information was average. Spontaneous delayed recall (i.e., retrieval) of previously learned information was variable, ranging from the below average to well above average normative ranges. Retention rates were 88% across a story learning task, 86% across a list learning task, and 180% across a shape learning task. Performance across recognition tasks was average, suggesting evidence for information consolidation.   Results of emotional screening instruments suggested that recent symptoms of generalized anxiety were in the moderate range, particularly driven by somatic manifestations. Symptoms of depression were within the mild range. A screening instrument assessing recent sleep quality suggested the presence of severe sleep dysfunction.  Tables of Scores:   Note: This summary of test scores accompanies the interpretive report and should not be considered in isolation without reference to the appropriate sections in the text. Descriptors are based on appropriate normative data and may be  adjusted based on clinical judgment. The terms "impaired" and "within normal limits (WNL)" are used when a more specific level of functioning cannot be determined.       Effort Testing:   DESCRIPTOR       ACS Word Choice: --- --- Within Expectation  Dot Counting Test: --- --- Within Expectation  NAB EVI: --- --- Within Expectation  D-KEFS Color Word Effort Index: --- --- Within Expectation       Orientation:      Raw Score Percentile   NAB Orientation, Form 1 29/29 --- ---       Cognitive Screening:           Raw Score Percentile   SLUMS: 26/30 --- ---       Intellectual Functioning:           Standard Score Percentile   Test of Premorbid Functioning: 105 63 Average       Memory:          NAB Memory Module, Form 1: Standard Score/ T Score Percentile   Total Memory Index 96 39 Average  List Learning       Total Trials 1-3 20/36 (44) 27 Average    List B 4/12 (48) 42 Average    Short Delay Free Recall 7/12 (48) 42 Average    Long Delay Free Recall 6/12 (44) 27 Average    Retention Percentage 86 (46) 34 Average    Recognition Discriminability 9 (54) 66 Average  Shape Learning       Total Trials 1-3 15/27 (49) 46 Average    Delayed Recall 9/9 (69) 97 Well Above Average    Retention Percentage 180 (76) >99 Exceptionally High    Recognition Discriminability 6 (47) 38 Average  Story Learning       Immediate Recall 59/80 (46) 34 Average    Delayed Recall 28/40 (41) 18 Below Average    Retention Percentage 88 (49) 46 Average  Daily Living Memory       Immediate Recall 43/51 (51) 54 Average    Delayed Recall 14/17 (49) 46 Average    Retention Percentage 88 (50) 50 Average    Recognition Hits 9/10 (52) 58 Average       Attention/Executive Function:          Trail Making Test (TMT): Raw Score (T Score) Percentile     Part A 19 secs.,  0 errors (69) 97 Well Above Average    Part B 57 secs.,  0 errors (58) 79 Above Average         Scaled Score Percentile   WAIS-IV  Coding: 11 63 Average       NAB Attention Module, Form 1: T Score Percentile     Digits Forward 61 86 Above Average    Digits Backwards 62 88 Above Average       D-KEFS Color-Word Interference Test: Raw Score (Scaled Score) Percentile     Color Naming 26 secs. (13) 84 Above Average    Word Reading 18 secs. (13) 84 Above Average    Inhibition 72 secs. (9) 37 Average      Total Errors 1 error (11) 63 Average    Inhibition/Switching 60 secs. (12) 75 Above Average      Total Errors 2 errors (11) 63 Average       [place] Card Sorting Test: Raw Score Percentile     Categories (trials) 4 (64) >16 Within Normal Limits    Total Errors 12 69 Average    Perseverative Errors 8 46 Average    Non-Perseverative Errors 4 66 Average    Failure to Maintain Set 0 --- ---       NAB Executive Functions Module, Form 1: T Score Percentile     Judgment 68 96 Well Above Average       Language:          Verbal Fluency Test: Raw Score (T Score) Percentile     Phonemic Fluency (FAS) 35 (45) 31 Average    Animal Fluency 12 (33) 5 Well Below Average        NAB Language Module, Form 1: T Score Percentile     Auditory Comprehension 58 79 Above Average    Naming 30/31 (56) 73 Average       Visuospatial/Visuoconstruction:      Raw Score Percentile   Clock Drawing: 10/10 --- Within Normal Limits       NAB Spatial Module, Form 1: T Score Percentile     Figure Drawing Copy 58 79 Above Average        Scaled Score Percentile   WAIS-IV Block Design: 8 25 Average       Mood and Personality:      Raw Score Percentile   Geriatric Depression Scale: 13 --- Mild  Geriatric Anxiety Scale: 27 --- Moderate    Somatic 19 --- Severe    Cognitive 5 --- Mild    Affective 3 --- Minimal       Additional Questionnaires:      Raw Score Percentile   PROMIS Sleep Disturbance Questionnaire: 38 --- Severe   Informed Consent and Coding/Compliance:   Angelica Carter was provided with a verbal description of the nature and  purpose of the present neuropsychological evaluation. Also reviewed were the foreseeable risks and/or discomforts and benefits of the procedure, limits of confidentiality, and mandatory reporting requirements of this provider. The patient was given the opportunity to ask questions and receive  answers about the evaluation. Oral consent to participate was provided by the patient.   This evaluation was conducted by Christia Reading, Ph.D., licensed clinical neuropsychologist. Ms. Waltzer completed a comprehensive clinical interview with Dr. Melvyn Novas, billed as one unit [phone], and 145 minutes of cognitive testing and scoring, billed as one unit [phone] and four additional units [id number]. Psychometrist Milana Kidney, B.S., assisted Dr. Melvyn Novas with test administration and scoring procedures. As a separate and discrete service, Dr. Melvyn Novas spent a total of 110 minutes in interpretation and report writing billed as one unit [phone] and one unit [phone].

## 2020-05-03 NOTE — Progress Notes (Signed)
   Psychometrician Note   Cognitive testing was administered to Angelica Carter by Angelica Carter, B.S. (psychometrist) under the supervision of Dr. Christia Reading, Ph.D., licensed psychologist on 05/03/20. [name] did not appear overtly distressed by the testing session per behavioral observation or responses across self-report questionnaires. Dr. Christia Reading, Ph.D. checked in with Angelica Carter as needed to manage any distress related to testing procedures (if applicable). Rest breaks were offered.    The battery of tests administered was selected by Dr. Christia Reading, Ph.D. with consideration to Angelica Carter's current level of functioning, the nature of her symptoms, emotional and behavioral responses during interview, level of literacy, observed level of motivation/effort, and the nature of the referral question. This battery was communicated to the psychometrist. Communication between Dr. Christia Reading, Ph.D. and the psychometrist was ongoing throughout the evaluation and Dr. Christia Reading, Ph.D. was immediately accessible at all times. Dr. Christia Reading, Ph.D. provided supervision to the psychometrist on the date of this service to the extent necessary to assure the quality of all services provided.    Angelica Carter will return within approximately 1-2 weeks for an interactive feedback session with Angelica Carter at which time her test performances, clinical impressions, and treatment recommendations will be reviewed in detail. Angelica Carter understands she can contact our office should she require our assistance before this time.  A total of 145 minutes of billable time were spent face-to-face with Angelica Carter by the psychometrist. This includes both test administration and scoring time. Billing for these services is reflected in the clinical report generated by Dr. Christia Reading, Ph.D..  This note reflects time spent with the psychometrician and does not include test scores or any clinical  interpretations made by Angelica Carter. The full report will follow in a separate note.

## 2020-05-06 LAB — ANTI-NUCLEAR AB-TITER (ANA TITER)
ANA TITER: 1:640 {titer} — ABNORMAL HIGH
ANA Titer 1: 1:320 {titer} — ABNORMAL HIGH

## 2020-05-06 LAB — HEPATITIS C ANTIBODY
Hepatitis C Ab: NONREACTIVE
SIGNAL TO CUT-OFF: 0.01 (ref <1.00)

## 2020-05-06 LAB — ANTI-SMOOTH MUSCLE ANTIBODY, IGG: Actin (Smooth Muscle) Antibody (IGG): <20 U (ref <20)

## 2020-05-06 LAB — HEPATITIS B SURFACE ANTIGEN: Hepatitis B Surface Ag: NONREACTIVE

## 2020-05-06 LAB — ANA: Anti Nuclear Antibody (ANA): POSITIVE — AB

## 2020-05-06 LAB — IGG: IgG (Immunoglobin G), Serum: 1133 mg/dL (ref 600–1540)

## 2020-05-06 LAB — HEPATITIS B SURFACE ANTIBODY,QUALITATIVE: Hep B S Ab: NONREACTIVE

## 2020-05-06 LAB — HEPATITIS A ANTIBODY, TOTAL: Hepatitis A AB,Total: BORDERLINE — AB

## 2020-05-09 ENCOUNTER — Other Ambulatory Visit: Payer: Self-pay

## 2020-05-09 DIAGNOSIS — R945 Abnormal results of liver function studies: Secondary | ICD-10-CM

## 2020-05-09 DIAGNOSIS — R7401 Elevation of levels of liver transaminase levels: Secondary | ICD-10-CM

## 2020-05-10 ENCOUNTER — Ambulatory Visit (INDEPENDENT_AMBULATORY_CARE_PROVIDER_SITE_OTHER): Payer: Medicare Other | Admitting: Psychology

## 2020-05-10 ENCOUNTER — Other Ambulatory Visit: Payer: Self-pay

## 2020-05-10 DIAGNOSIS — F411 Generalized anxiety disorder: Secondary | ICD-10-CM

## 2020-05-10 DIAGNOSIS — R41840 Attention and concentration deficit: Secondary | ICD-10-CM

## 2020-05-10 DIAGNOSIS — F330 Major depressive disorder, recurrent, mild: Secondary | ICD-10-CM

## 2020-05-10 DIAGNOSIS — G4733 Obstructive sleep apnea (adult) (pediatric): Secondary | ICD-10-CM

## 2020-05-10 NOTE — Progress Notes (Signed)
   Neuropsychology Feedback Session Tillie Rung. [facility] Department of Neurology  Reason for Referral:   Krystena Reitter a 65 y.o. mixed-handed (left hand to eat and write, right hand for everything else) Caucasian female referred by Metta Clines, D.O.,to characterize hercurrent cognitive functioning and assist with diagnostic clarity and treatment planning in the context of subjective cognitive decline and several psychiatric and medical comorbidities.   Feedback:   Ms. Longo completed a comprehensive neuropsychological evaluation on 05/03/2020. Please refer to that encounter for the full report and recommendations. Briefly, results suggested neuropsychological functioning within normal limits. An isolated normative weakness was exhibited across a semantic fluency task. Mild performance variability was also exhibited across retrieval aspects of memory. However, despite this variability, retrieval aspects of memory were overall appropriate. Likewise, performance was appropriate across all other assessed cognitive domains. The etiology of subjective day-to-day cognitive dysfunction is likely multifactorial in nature. She reported a history of ADHD diagnosed in childhood which is not currently treated. Across mood-related questionnaires, she reported acute symptoms of moderate anxiety and mild depression. She also reported currently untreated obstructive sleep apnea and symptoms of chronic pain. Overall, it is certainly possible that subjective dysfunction is related to underlying attentional dysregulation, made worse by sleep dysfunction, psychiatric distress, and chronic pain symptoms.  Ms. Bevans was unaccompanied during the current feedback session. Content of the current session focused on the results of her neuropsychological evaluation. Ms. Trego was given the opportunity to ask questions and her questions were answered. She was encouraged to reach out should additional questions  arise. A copy of her report was provided at the conclusion of the visit.      Less than 16 minutes were spent conducting the current feedback session with Ms. Rhine.

## 2020-05-10 NOTE — Patient Instructions (Signed)
[name] reported that she has not utilized her CPAP machine for the past 8-12 months due to it being broken and her previously being sent incorrect parts. I would strongly recommend that she reach out to her PCP/sleep specialist and work together to discover why this is occurring. It is very important that this condition is treated as it is likely directly responsible for ongoing cognitive dysfunction. Untreated or poorly managed sleep apnea will increase her risk for stroke, heart attack, and future cognitive decline.   A combination of medication and psychotherapy has been shown to be most effective at treating symptoms of anxiety and depression. She reported that she has been prescribed mood-related medications but has not taken them as she does not "trust" them. Angelica Carter is encouraged to speak with her prescribing physician regarding her concerns surrounding these medications so that trust concerns can be alleviated.  Likewise, Angelica Carter is encouraged to consider engaging in short-term psychotherapy to address symptoms of psychiatric distress. She would benefit from an active and collaborative therapeutic environment, rather than one purely supportive in nature. Recommended treatment modalities include Cognitive Behavioral Therapy (CBT) or Acceptance and Commitment Therapy (ACT).  Should mood, sleep, and pain symptoms become better managed and she continue to report cognitive and/or functional decline, a repeat evaluation would be warranted at that time. The current evaluation will serve as an excellent baseline to compare future evaluations against.    Angelica Carter is encouraged to attend to lifestyle factors for brain health (e.g., regular physical exercise, good nutrition habits, regular participation in cognitively-stimulating activities, and general stress management techniques), which are likely to have benefits for both emotional adjustment and cognition. In fact, in addition to promoting good  general health, regular exercise incorporating aerobic activities (e.g., brisk walking, jogging, cycling, etc.) has been demonstrated to be a very effective treatment for depression and stress, with similar efficacy rates to both antidepressant medication and psychotherapy. Optimal control of vascular risk factors (including safe cardiovascular exercise and adherence to dietary recommendations) is encouraged. Likewise, continued compliance with her CPAP machine will also be important.  If interested, there are some activities which have therapeutic value and can be useful in keeping her cognitively stimulated. For suggestions, Angelica Carter is encouraged to go to the following website: https://www.[facility].org/get-to-know-barrow/centers-programs/neurorehabilitation-center/neuro-rehab-apps-and-games/ which has options, categorized by level of difficulty. It should be noted that these activities should not be viewed as a substitute for therapy.  When learning new information, she would benefit from information being broken up into small, manageable pieces. She may also find it helpful to articulate the material in her own words and in a context to promote encoding at the onset of a new task. This material may need to be repeated multiple times to promote encoding.  Memory can be improved using internal strategies such as rehearsal, repetition, chunking, mnemonics, association, and imagery. External strategies such as written notes in a consistently used memory journal, visual and nonverbal auditory cues such as a calendar on the refrigerator or appointments with alarm, such as on a cell phone, can also help maximize recall.    To address problems with fluctuating attention, she may wish to consider:   -Avoiding external distractions when needing to concentrate   -Limiting exposure to fast paced environments with multiple sensory demands   -Writing down complicated information and using checklists    -Attempting and completing one task at a time (i.e., no multi-tasking)   -Verbalizing aloud each step of a task to maintain focus   -  Reducing the amount of information considered at one time

## 2020-05-16 ENCOUNTER — Ambulatory Visit (INDEPENDENT_AMBULATORY_CARE_PROVIDER_SITE_OTHER): Payer: Medicare Other | Admitting: Gastroenterology

## 2020-05-16 DIAGNOSIS — Z23 Encounter for immunization: Secondary | ICD-10-CM

## 2020-06-04 ENCOUNTER — Other Ambulatory Visit: Payer: Self-pay | Admitting: Family

## 2020-06-04 DIAGNOSIS — R059 Cough, unspecified: Secondary | ICD-10-CM

## 2020-06-04 DIAGNOSIS — Z720 Tobacco use: Secondary | ICD-10-CM

## 2020-06-20 ENCOUNTER — Ambulatory Visit
Admission: RE | Admit: 2020-06-20 | Discharge: 2020-06-20 | Disposition: A | Discharge status: Home / Self Care | Payer: Medicare Other | Source: Ambulatory Visit | Attending: Family | Admitting: Family

## 2020-06-20 ENCOUNTER — Other Ambulatory Visit: Payer: Self-pay | Admitting: Family

## 2020-06-20 ENCOUNTER — Other Ambulatory Visit: Payer: Self-pay

## 2020-06-20 DIAGNOSIS — M25561 Pain in right knee: Secondary | ICD-10-CM

## 2020-06-20 DIAGNOSIS — Z720 Tobacco use: Secondary | ICD-10-CM

## 2020-06-20 DIAGNOSIS — M25562 Pain in left knee: Secondary | ICD-10-CM

## 2020-06-20 DIAGNOSIS — R059 Cough, unspecified: Secondary | ICD-10-CM

## 2020-06-20 IMAGING — CR DG KNEE COMPLETE 4+V*L*
4 series · 4 of 4 positions shown · non-contrast
Comparison: None

CLINICAL DATA: BILATERAL medial knee pain of unspecified
chronicity, greater on RIGHT

EXAM:
LEFT KNEE - COMPLETE 4+ VIEW

[w knee tunnel pa left]
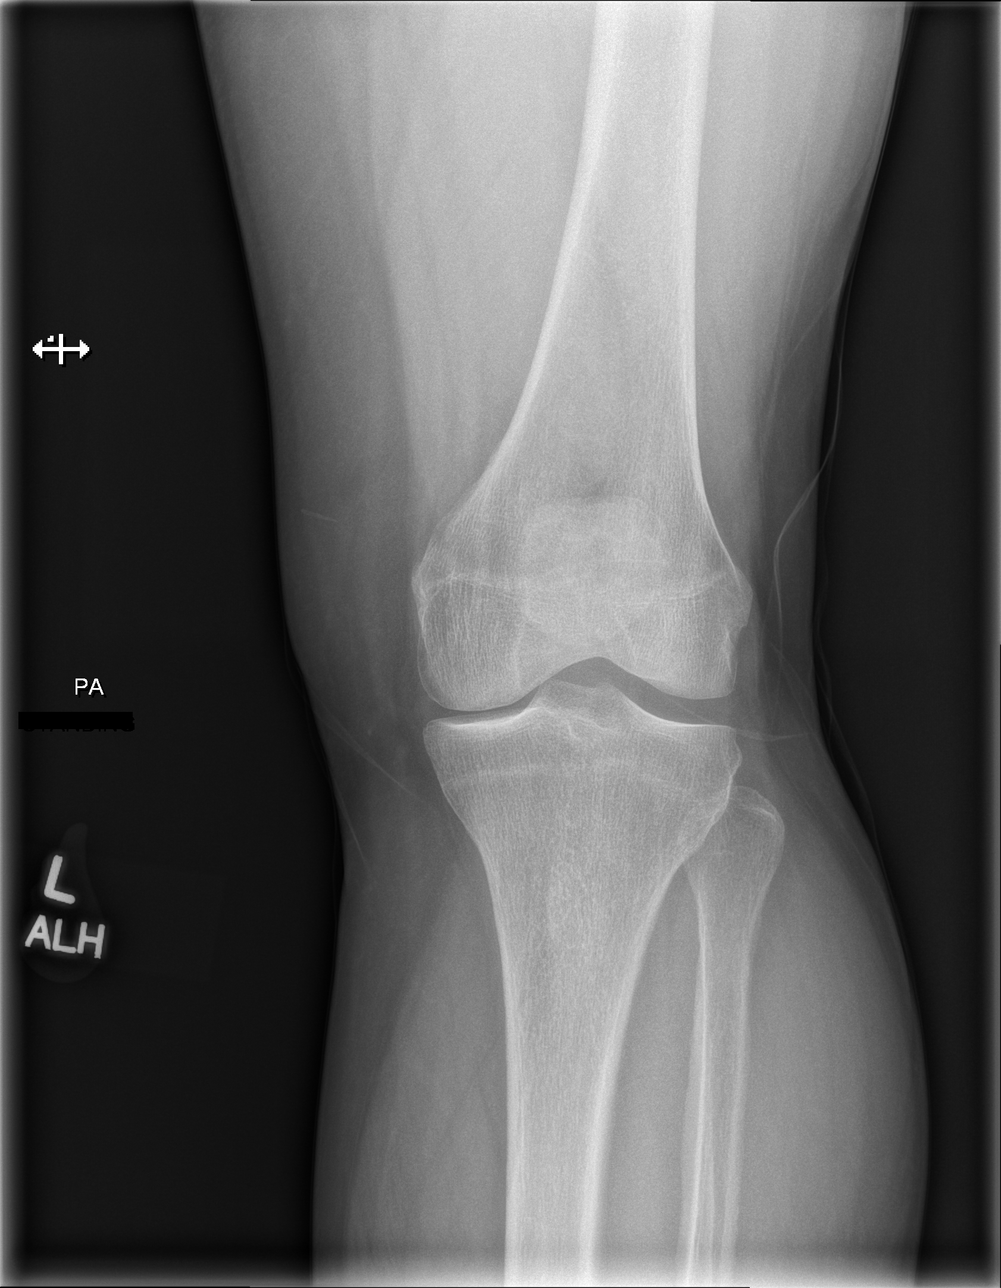

[w knee lat left]
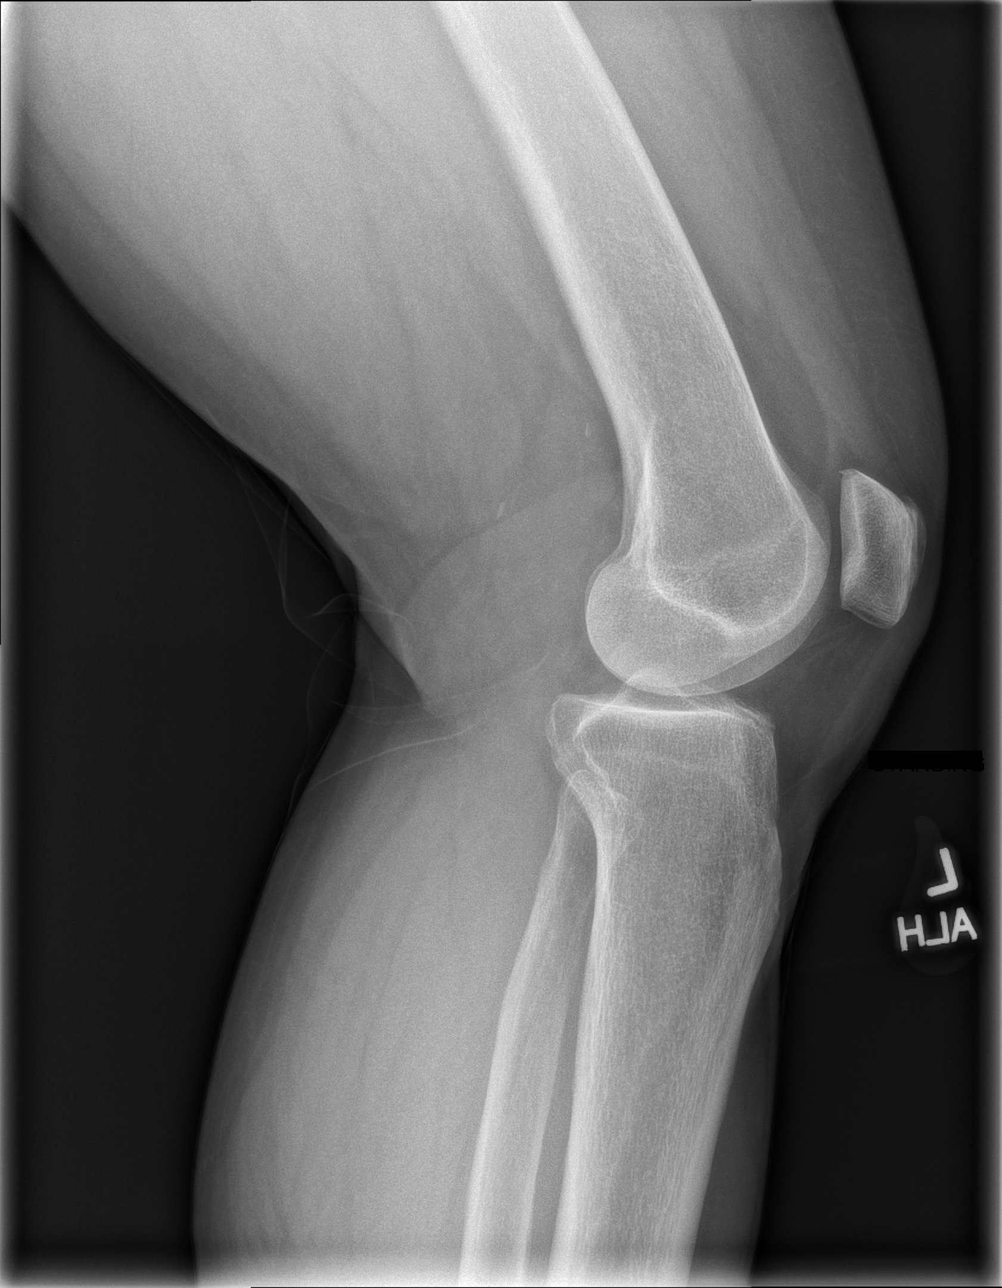

[w knee ap left]
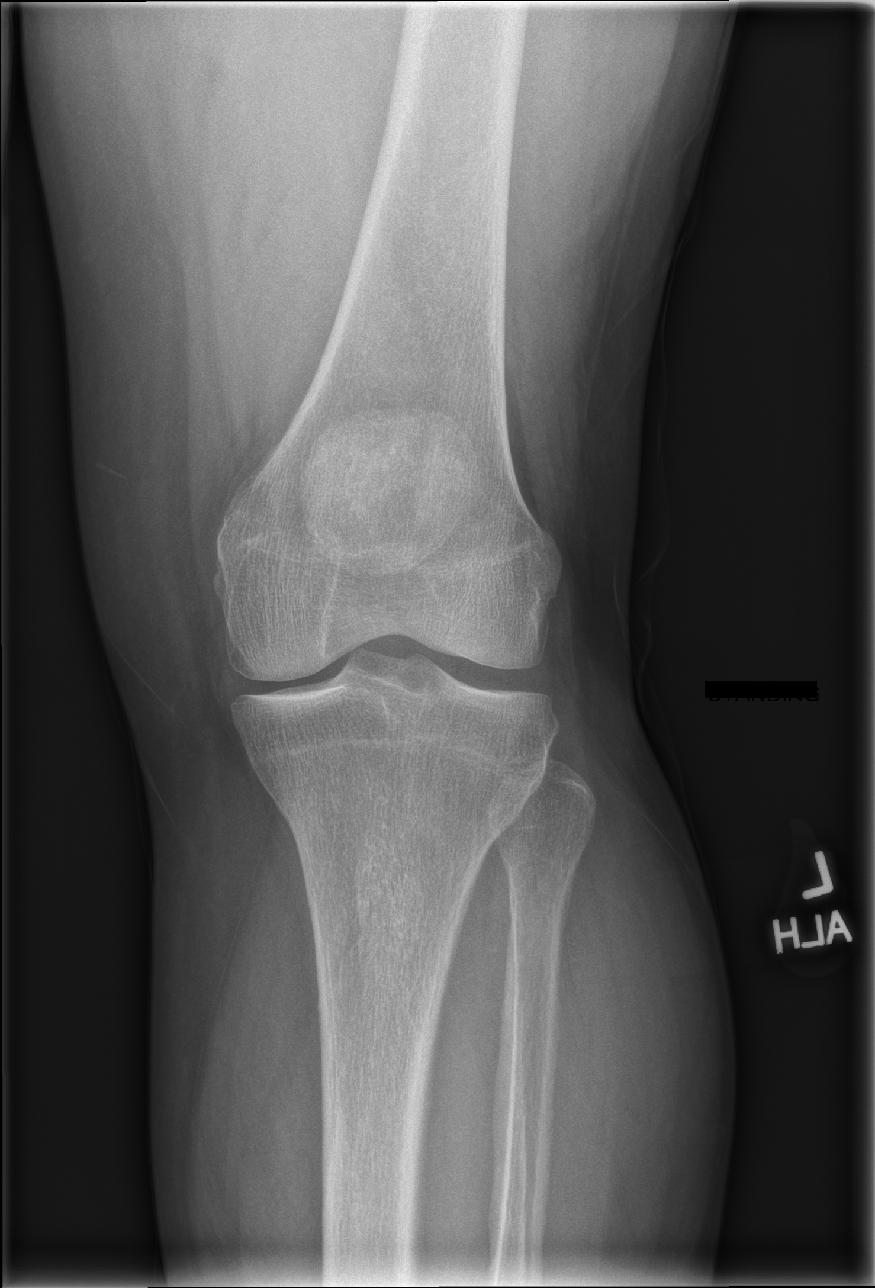

[x knee sunrise left]
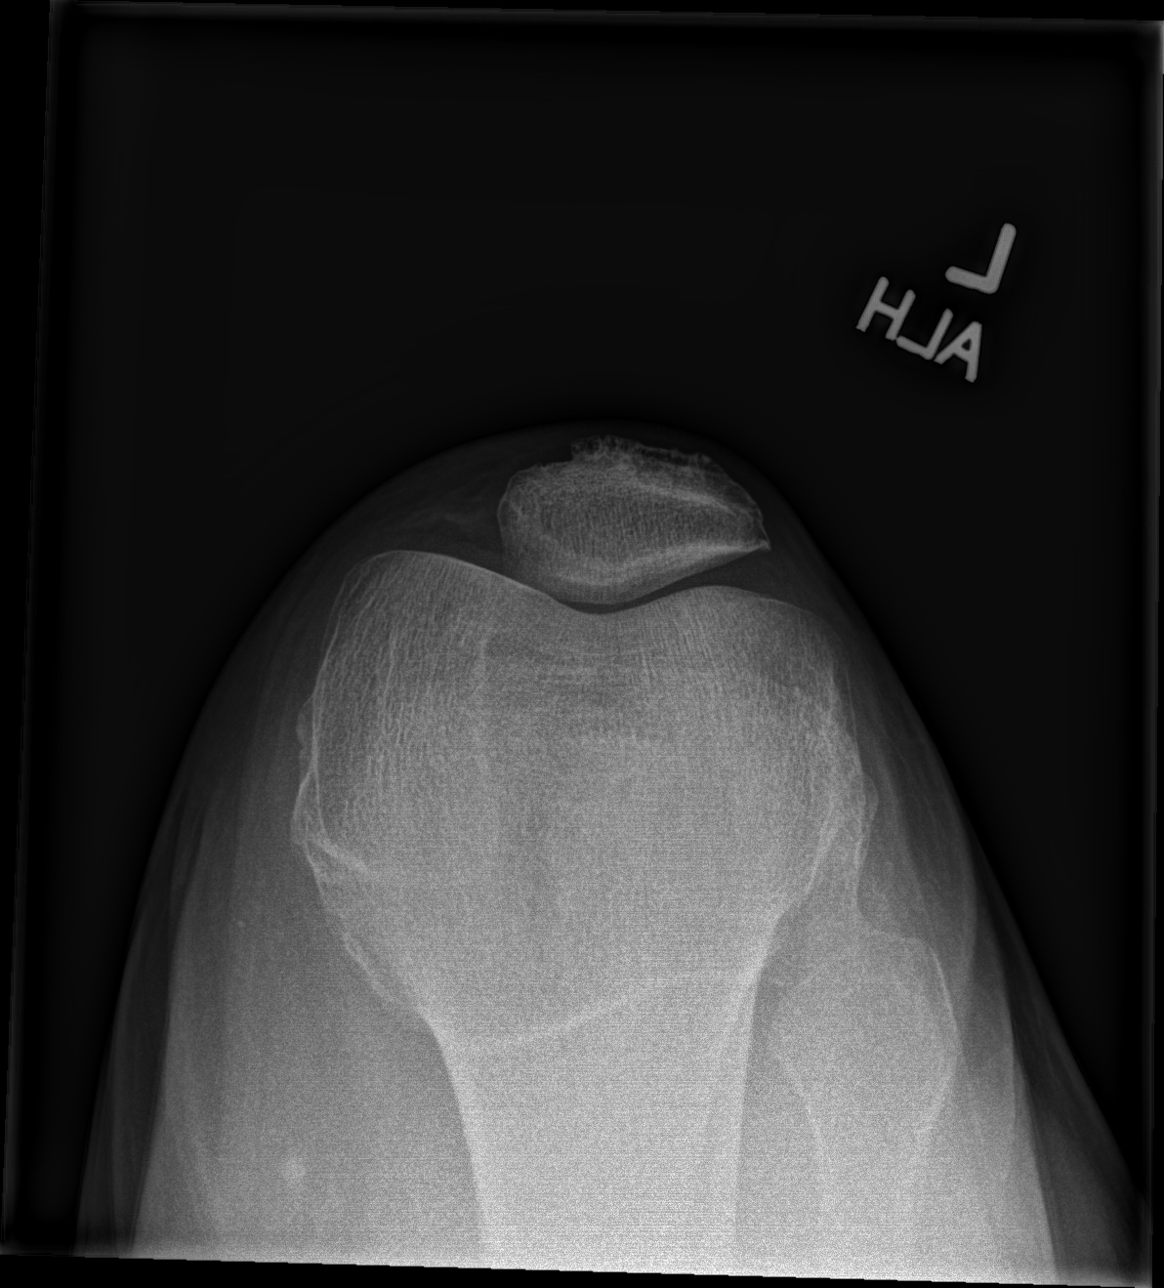

[4 of 4 positions shown; findings below may reference images not displayed]

FINDINGS: Medial compartment joint space narrowing.

Osseous mineralization normal.

No acute fracture, dislocation, or bone destruction.

No joint effusion.
IMPRESSION: Degenerative changes at medial compartment LEFT knee.

## 2020-06-20 IMAGING — CT CT CHEST LUNG CANCER SCREENING LOW DOSE W/O CM
1 of 3 series · 10 of 40 positions shown, 13 images · non-contrast
Comparison: None.

CLINICAL DATA: Current smoker, 80 pack-year history.

EXAM:
CT CHEST WITHOUT CONTRAST LOW-DOSE FOR LUNG CANCER SCREENING
TECHNIQUE: Multidetector CT imaging of the chest was performed following the
standard protocol without IV contrast.

[ct lung segmentation data · axial · 0.70mm/px · z∈[-322,-322]mm · 10 of 302 frames shown]
[frame 1/302  mediastinal]
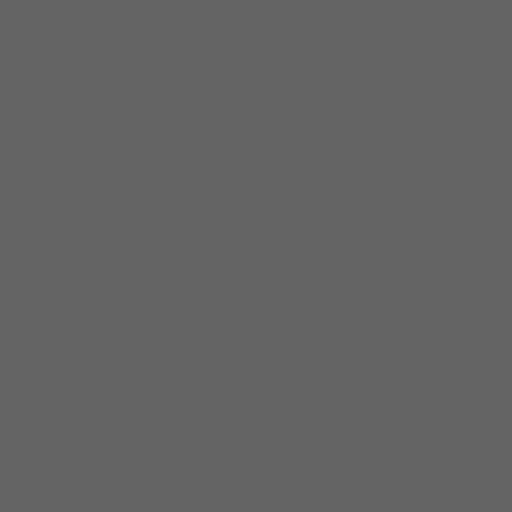
[frame 1/302  lung]
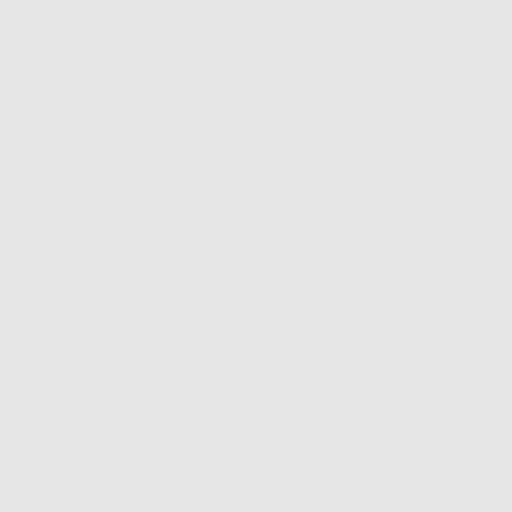
[frame 34/302  lung]
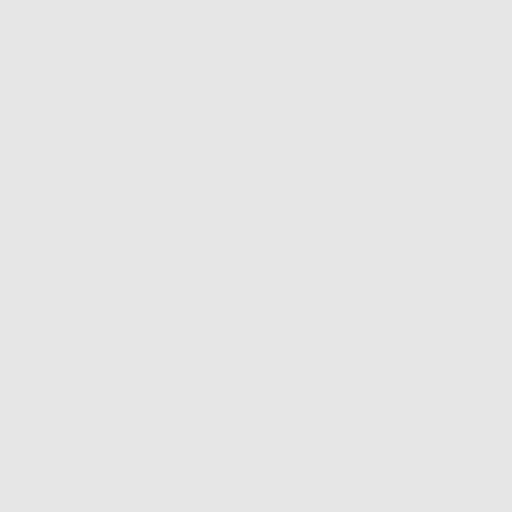
[frame 67/302  lung]
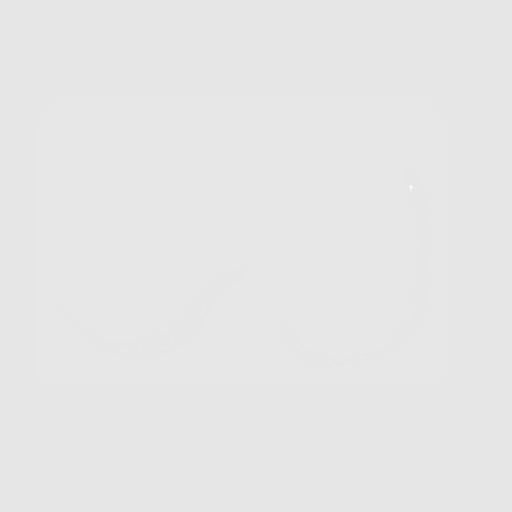
[frame 101/302  lung]
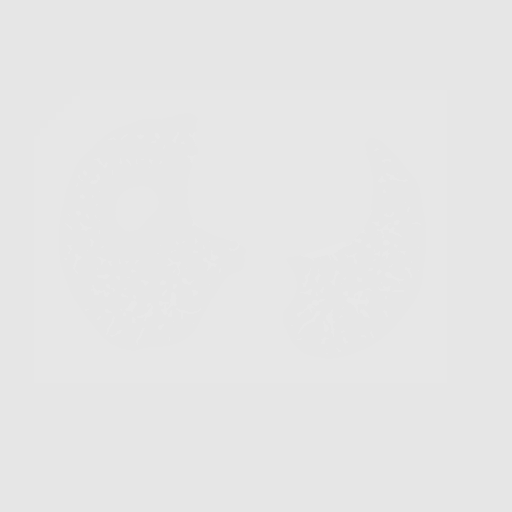
[frame 134/302  mediastinal]
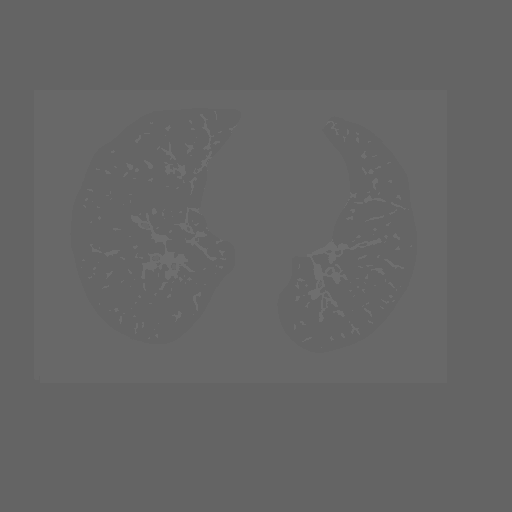
[frame 134/302  lung]
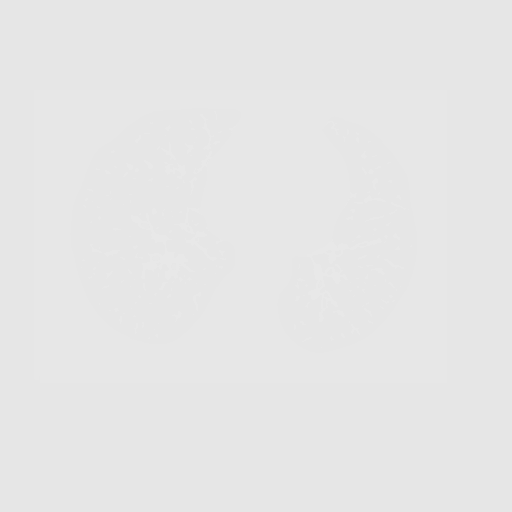
[frame 168/302  lung]
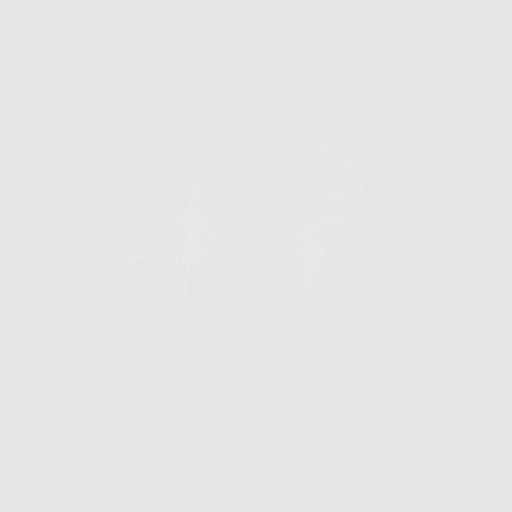
[frame 201/302  lung]
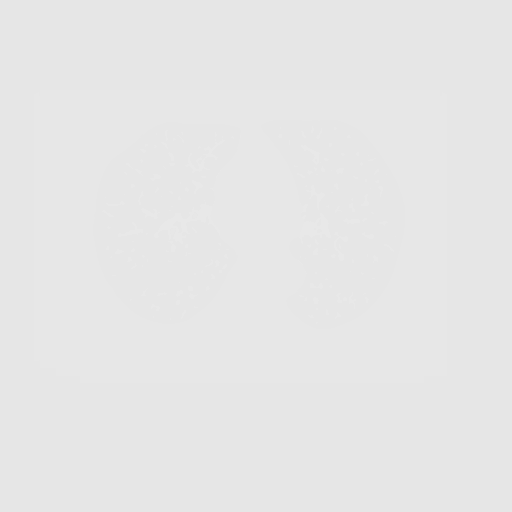
[frame 235/302  lung]
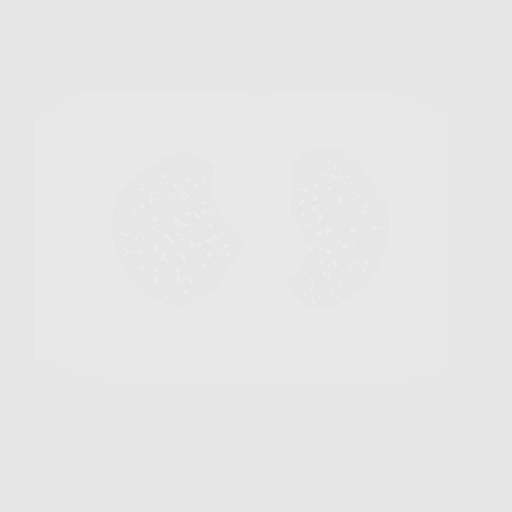
[frame 268/302  mediastinal]
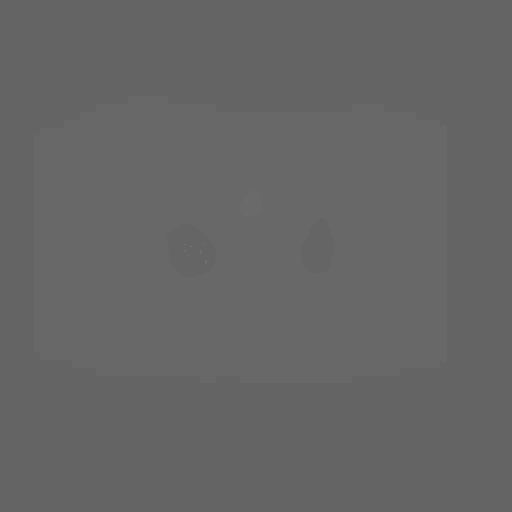
[frame 268/302  lung]
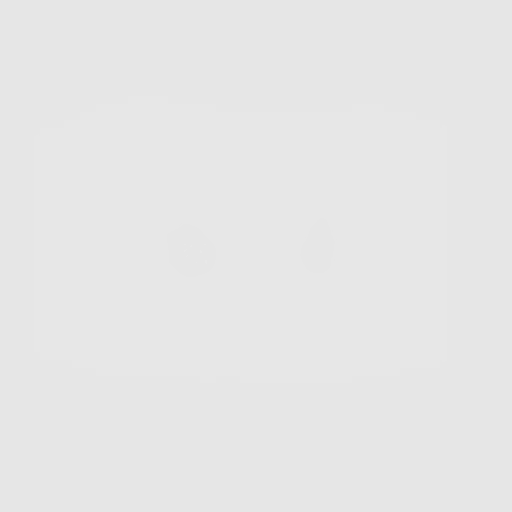
[frame 302/302  lung]
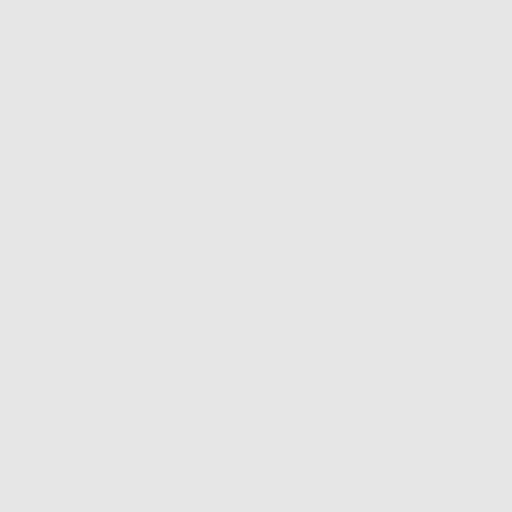

[10 of 40 positions shown; findings below may reference images not displayed]

FINDINGS: Cardiovascular: Atherosclerotic calcification of the aorta and
coronary arteries. Heart size normal. No pericardial effusion.

Mediastinum/Nodes: No pathologically enlarged mediastinal or
axillary lymph nodes. Hilar regions are difficult to definitively
evaluate without IV contrast but appear grossly unremarkable.
Esophagus is grossly unremarkable.

Lungs/Pleura: Smoking related respiratory bronchiolitis. Mild
centrilobular emphysema. No suspicious pulmonary nodules. No pleural
fluid. Minimal adherent debris in the airway.

Upper Abdomen: Visualized portions of the liver, gallbladder and
adrenal glands are unremarkable. Right kidney is markedly atrophic.
Visualized portions of the left kidney, spleen, pancreas, stomach
and bowel are grossly unremarkable.

Musculoskeletal: None.
IMPRESSION: 1. Lung-RADS 1, negative. Continue annual screening with low-dose
chest CT without contrast in 12 months.
2.  Aortic atherosclerosis ([2G]-[2G]).
3.  Emphysema ([2G]-[2G]).

## 2020-06-20 IMAGING — CR DG KNEE COMPLETE 4+V*R*
4 series · 4 of 4 positions shown · non-contrast
Comparison: None

CLINICAL DATA: BILATERAL knee pain, fell on ice 6 weeks ago, pain
greater on RIGHT

EXAM:
RIGHT KNEE - COMPLETE 4+ VIEW

[w knee ap right]
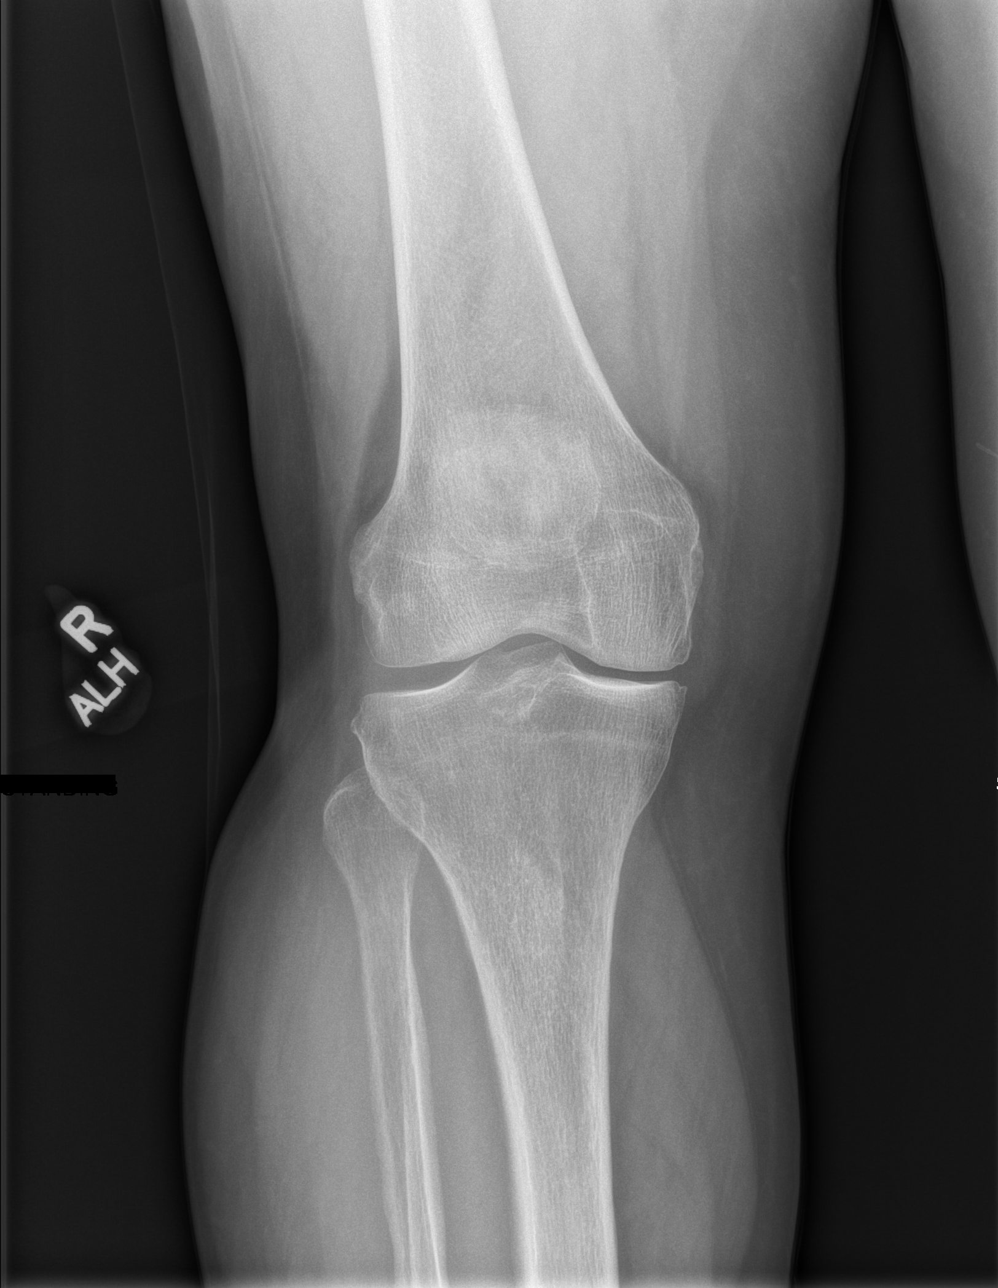

[w knee lat right]
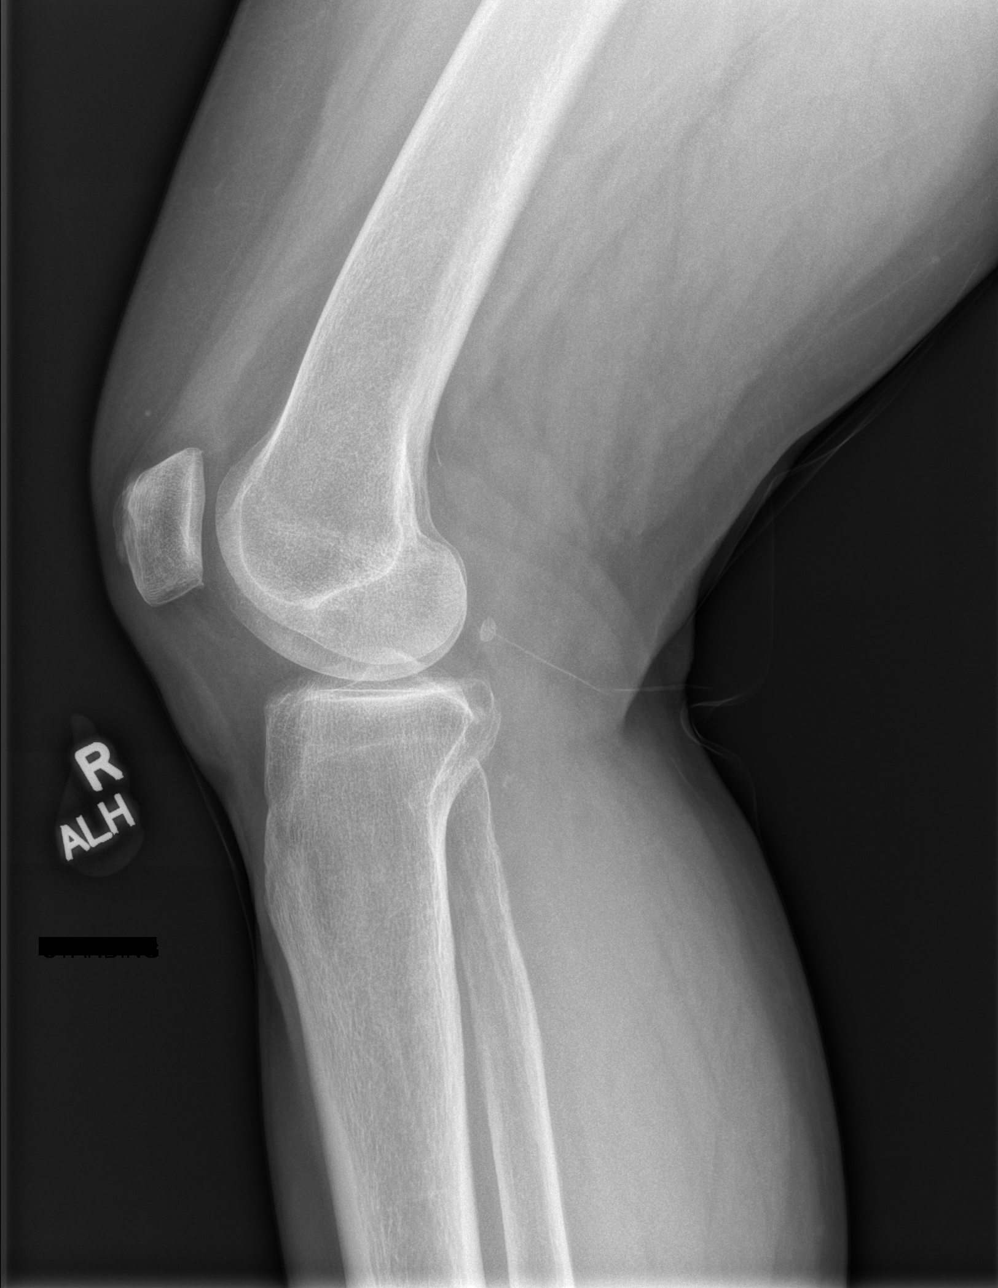

[w knee tunnel pa right]
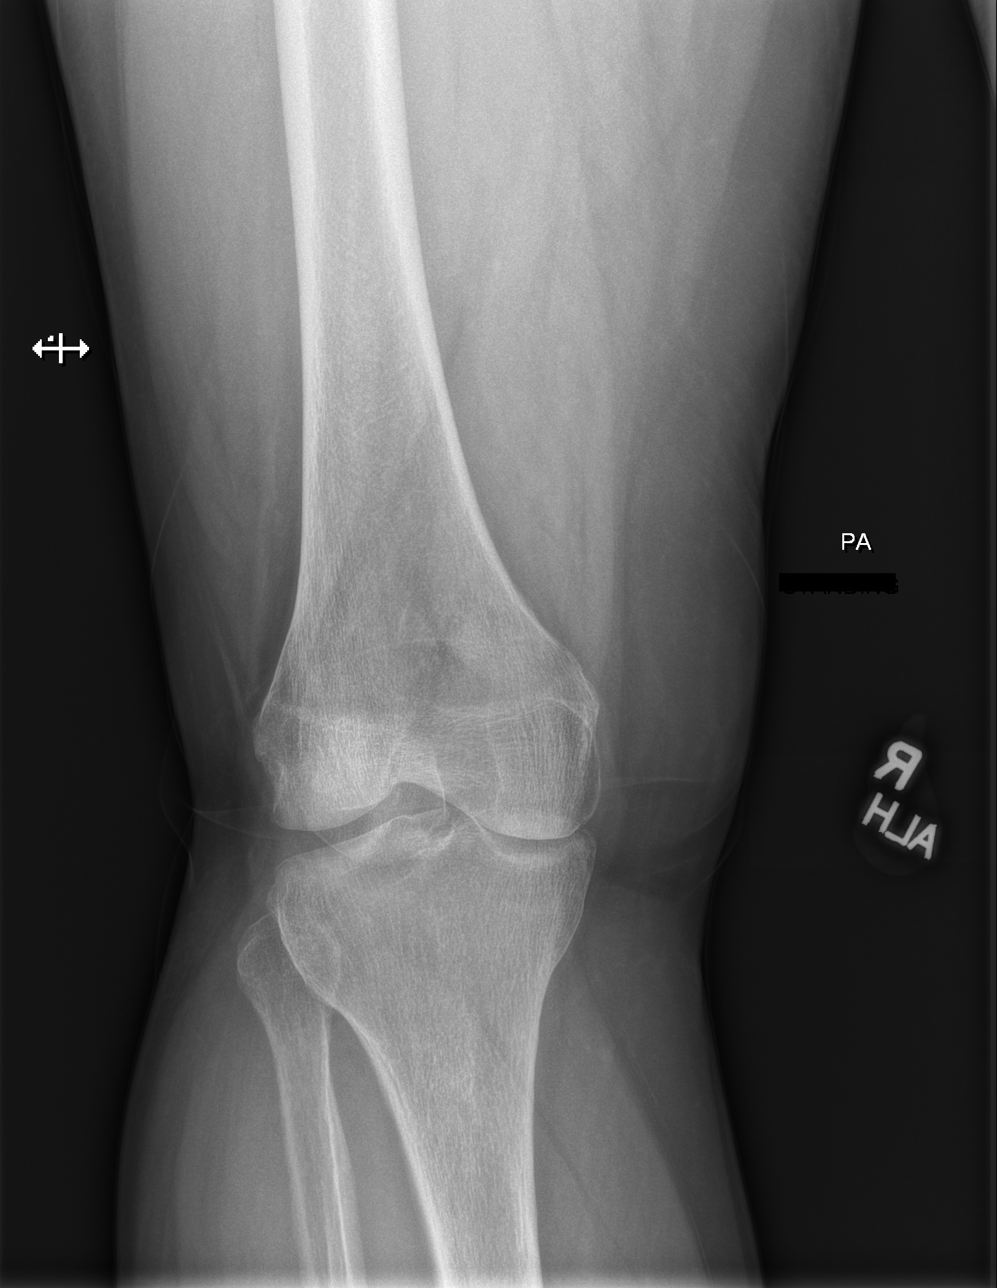

[x knee sunrise right]
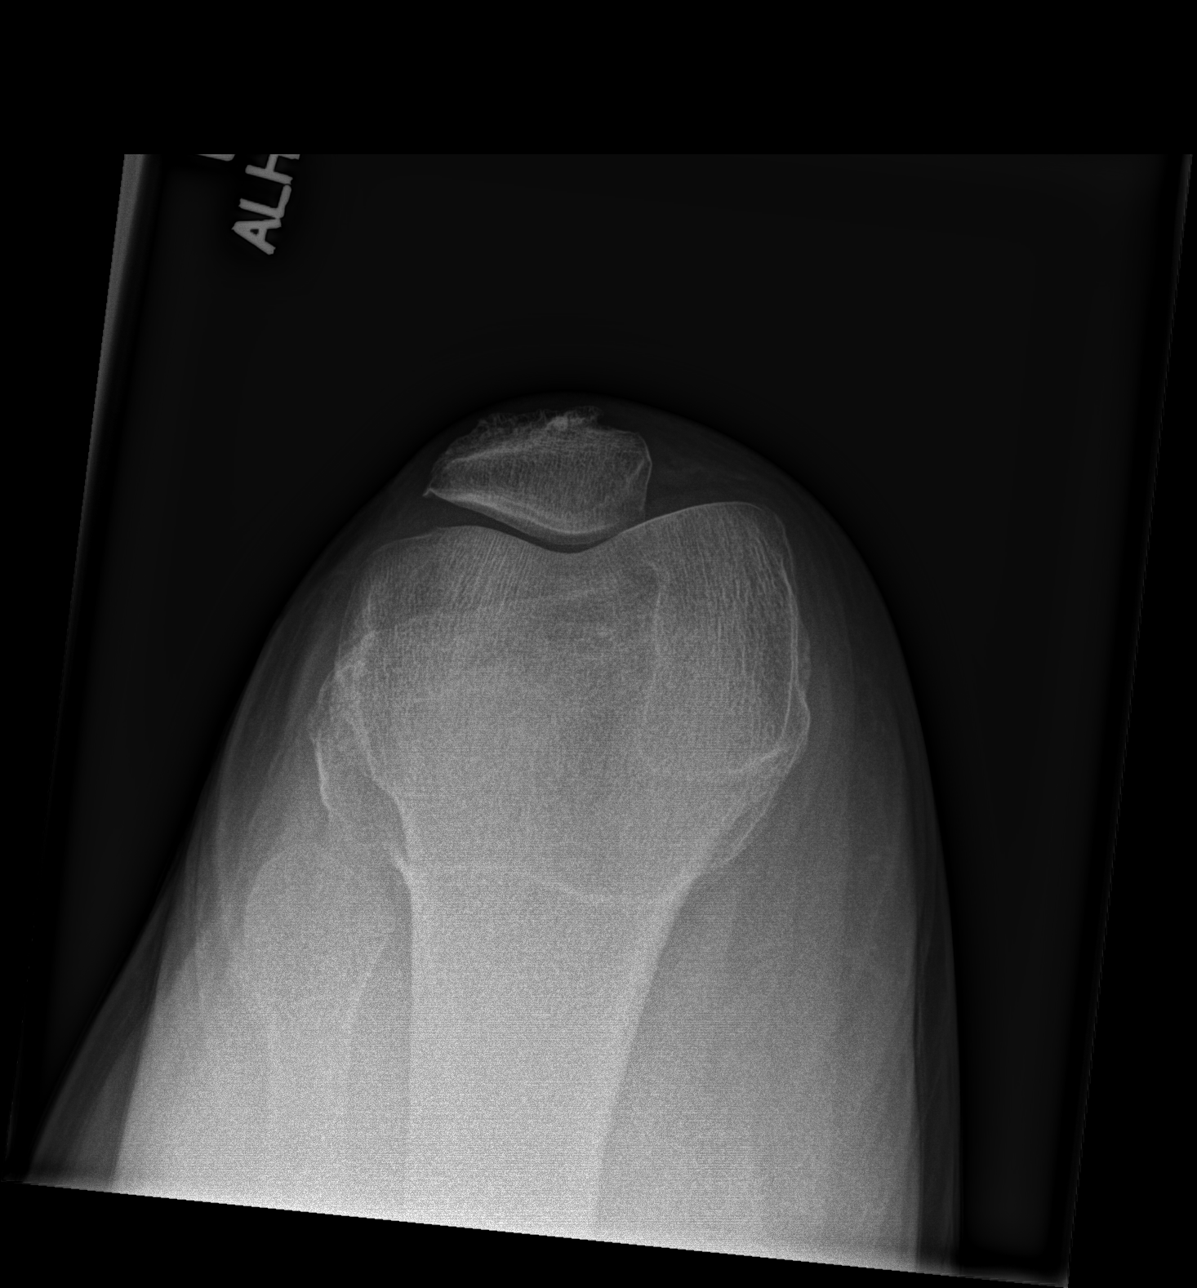

[4 of 4 positions shown; findings below may reference images not displayed]

FINDINGS: Osseous demineralization.

Medial compartment joint space narrowing.

No acute fracture, dislocation, or bone destruction.

No joint effusion.
IMPRESSION: Degenerative changes at medial compartment RIGHT knee.

## 2020-07-03 ENCOUNTER — Ambulatory Visit (INDEPENDENT_AMBULATORY_CARE_PROVIDER_SITE_OTHER): Payer: Medicare Other | Admitting: Gastroenterology

## 2020-07-03 DIAGNOSIS — Z23 Encounter for immunization: Secondary | ICD-10-CM | POA: Diagnosis not present

## 2020-08-07 ENCOUNTER — Telehealth: Payer: Self-pay

## 2020-08-07 NOTE — Telephone Encounter (Signed)
-----   Message from Yevette Edwards, RN sent at 05/09/2020  1:29 PM EST ----- Regarding: Labs Repeat hepatic function panel. Order in epic.

## 2020-08-07 NOTE — Telephone Encounter (Signed)
Spoke with patient to remind her that she is due for repeat labs at this time. Patient is aware that no appt is necessary and she can stop by the lab in the basement at her convenience between 7:30 AM - 5 PM, Monday through Friday. Patient verbalized understanding and had no concerns at the end of the call.

## 2020-09-12 ENCOUNTER — Ambulatory Visit: Payer: Medicare Other | Admitting: Neurology

## 2020-09-17 ENCOUNTER — Emergency Department (HOSPITAL_COMMUNITY): Payer: Medicare Other

## 2020-09-17 ENCOUNTER — Encounter (HOSPITAL_COMMUNITY): Payer: Self-pay | Admitting: Radiology

## 2020-09-17 ENCOUNTER — Emergency Department (HOSPITAL_COMMUNITY)
Admission: EM | Admit: 2020-09-17 | Discharge: 2020-09-17 | Disposition: A | Discharge status: Home / Self Care | Payer: Medicare Other | Attending: Emergency Medicine | Admitting: Emergency Medicine

## 2020-09-17 DIAGNOSIS — J449 Chronic obstructive pulmonary disease, unspecified: Secondary | ICD-10-CM | POA: Insufficient documentation

## 2020-09-17 DIAGNOSIS — Z79899 Other long term (current) drug therapy: Secondary | ICD-10-CM | POA: Insufficient documentation

## 2020-09-17 DIAGNOSIS — R55 Syncope and collapse: Secondary | ICD-10-CM | POA: Diagnosis present

## 2020-09-17 DIAGNOSIS — R202 Paresthesia of skin: Secondary | ICD-10-CM

## 2020-09-17 DIAGNOSIS — W1839XA Other fall on same level, initial encounter: Secondary | ICD-10-CM | POA: Diagnosis not present

## 2020-09-17 DIAGNOSIS — J45909 Unspecified asthma, uncomplicated: Secondary | ICD-10-CM | POA: Diagnosis not present

## 2020-09-17 DIAGNOSIS — Z20822 Contact with and (suspected) exposure to covid-19: Secondary | ICD-10-CM | POA: Insufficient documentation

## 2020-09-17 DIAGNOSIS — F17210 Nicotine dependence, cigarettes, uncomplicated: Secondary | ICD-10-CM | POA: Insufficient documentation

## 2020-09-17 DIAGNOSIS — R531 Weakness: Secondary | ICD-10-CM | POA: Diagnosis not present

## 2020-09-17 DIAGNOSIS — T1490XA Injury, unspecified, initial encounter: Secondary | ICD-10-CM

## 2020-09-17 LAB — CBC
HCT: 47.1 % — ABNORMAL HIGH (ref 36.0–46.0)
Hemoglobin: 14.8 g/dL (ref 12.0–15.0)
MCH: 28.4 pg (ref 26.0–34.0)
MCHC: 31.4 g/dL (ref 30.0–36.0)
MCV: 90.4 fL (ref 80.0–100.0)
Platelets: 219 10*3/uL (ref 150–400)
RBC: 5.21 MIL/uL — ABNORMAL HIGH (ref 3.87–5.11)
RDW: 14.5 % (ref 11.5–15.5)
WBC: 11.2 10*3/uL — ABNORMAL HIGH (ref 4.0–10.5)
nRBC: 0 % (ref 0.0–0.2)

## 2020-09-17 LAB — DIFFERENTIAL
Abs Immature Granulocytes: 0.11 10*3/uL — ABNORMAL HIGH (ref 0.00–0.07)
Basophils Absolute: 0.1 10*3/uL (ref 0.0–0.1)
Basophils Relative: 1 %
Eosinophils Absolute: 0.1 10*3/uL (ref 0.0–0.5)
Eosinophils Relative: 0 %
Immature Granulocytes: 1 %
Lymphocytes Relative: 14 %
Lymphs Abs: 1.6 10*3/uL (ref 0.7–4.0)
Monocytes Absolute: 0.5 10*3/uL (ref 0.1–1.0)
Monocytes Relative: 5 %
Neutro Abs: 8.9 10*3/uL — ABNORMAL HIGH (ref 1.7–7.7)
Neutrophils Relative %: 79 %

## 2020-09-17 LAB — URINALYSIS, ROUTINE W REFLEX MICROSCOPIC
Bilirubin Urine: NEGATIVE
Glucose, UA: NEGATIVE mg/dL
Hgb urine dipstick: NEGATIVE
Ketones, ur: NEGATIVE mg/dL
Leukocytes,Ua: NEGATIVE
Nitrite: NEGATIVE
Protein, ur: NEGATIVE mg/dL
Specific Gravity, Urine: 1.032 — ABNORMAL HIGH (ref 1.005–1.030)
pH: 6 (ref 5.0–8.0)

## 2020-09-17 LAB — I-STAT CHEM 8, ED
BUN: 18 mg/dL (ref 8–23)
Calcium, Ion: 1.1 mmol/L — ABNORMAL LOW (ref 1.15–1.40)
Chloride: 105 mmol/L (ref 98–111)
Creatinine, Ser: 1.1 mg/dL — ABNORMAL HIGH (ref 0.44–1.00)
Glucose, Bld: 84 mg/dL (ref 70–99)
HCT: 45 % (ref 36.0–46.0)
Hemoglobin: 15.3 g/dL — ABNORMAL HIGH (ref 12.0–15.0)
Potassium: 4.2 mmol/L (ref 3.5–5.1)
Sodium: 138 mmol/L (ref 135–145)
TCO2: 25 mmol/L (ref 22–32)

## 2020-09-17 LAB — RAPID URINE DRUG SCREEN, HOSP PERFORMED
Amphetamines: NOT DETECTED
Barbiturates: NOT DETECTED
Benzodiazepines: NOT DETECTED
Cocaine: NOT DETECTED
Opiates: NOT DETECTED
Tetrahydrocannabinol: POSITIVE — AB

## 2020-09-17 LAB — COMPREHENSIVE METABOLIC PANEL
ALT: 54 U/L — ABNORMAL HIGH (ref 0–44)
AST: 34 U/L (ref 15–41)
Albumin: 3.7 g/dL (ref 3.5–5.0)
Alkaline Phosphatase: 72 U/L (ref 38–126)
Anion gap: 9 (ref 5–15)
BUN: 15 mg/dL (ref 8–23)
CO2: 22 mmol/L (ref 22–32)
Calcium: 9.1 mg/dL (ref 8.9–10.3)
Chloride: 105 mmol/L (ref 98–111)
Creatinine, Ser: 1.2 mg/dL — ABNORMAL HIGH (ref 0.44–1.00)
GFR, Estimated: 50 mL/min — ABNORMAL LOW (ref >60)
Glucose, Bld: 87 mg/dL (ref 70–99)
Potassium: 4.4 mmol/L (ref 3.5–5.1)
Sodium: 136 mmol/L (ref 135–145)
Total Bilirubin: 0.8 mg/dL (ref 0.3–1.2)
Total Protein: 6.6 g/dL (ref 6.5–8.1)

## 2020-09-17 LAB — CBG MONITORING, ED: Glucose-Capillary: 113 mg/dL — ABNORMAL HIGH (ref 70–99)

## 2020-09-17 LAB — PROTIME-INR
INR: 1.1 (ref 0.8–1.2)
Prothrombin Time: 14.4 seconds (ref 11.4–15.2)

## 2020-09-17 LAB — RESP PANEL BY RT-PCR (FLU A&B, COVID) ARPGX2
Influenza A by PCR: NEGATIVE
Influenza B by PCR: NEGATIVE
SARS Coronavirus 2 by RT PCR: NEGATIVE

## 2020-09-17 LAB — APTT: aPTT: 26 seconds (ref 24–36)

## 2020-09-17 LAB — ETHANOL: Alcohol, Ethyl (B): <10 mg/dL (ref <10)

## 2020-09-17 IMAGING — CT CT HEAD CODE STROKE
4 series · 16 of 47 positions shown, 18 images · non-contrast
Comparison: MRI [DATE].

CLINICAL DATA: Code stroke. Neuro deficit, acute stroke suspected.
Left sided weakness.

EXAM:
CT HEAD WITHOUT CONTRAST
TECHNIQUE: Contiguous axial images were obtained from the base of the skull
through the vertex without intravenous contrast.

[Series 4: head wo · axial · 0.40mm/px · z∈[+1100,+1220]mm · 7 of 33 slices shown, 9 images]
[im 5/33  brain]
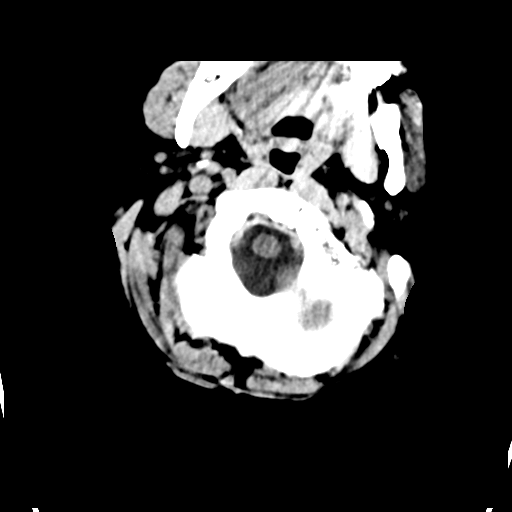
[im 5/33  bone]
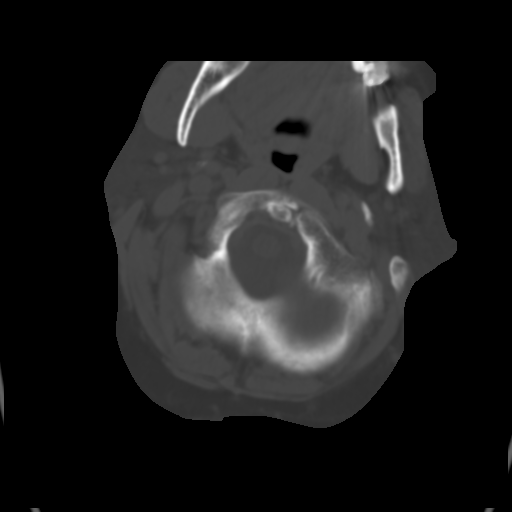
[im 9/33  brain]
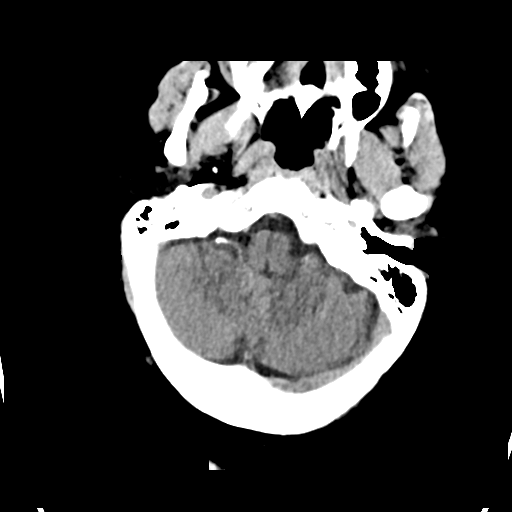
[im 13/33  brain]
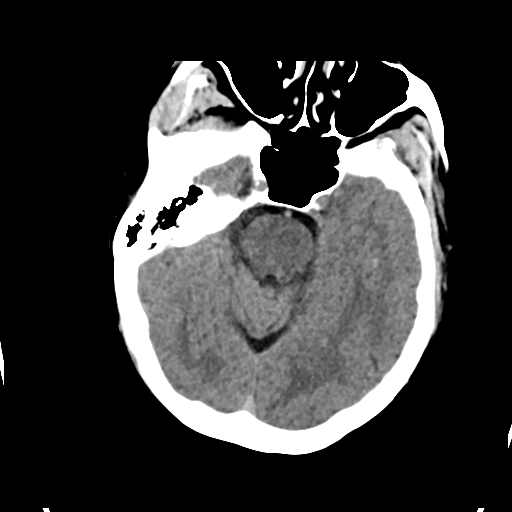
[im 17/33  brain]
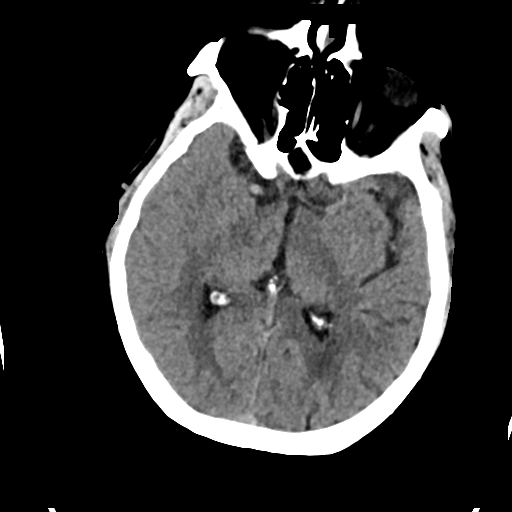
[im 21/33  brain]
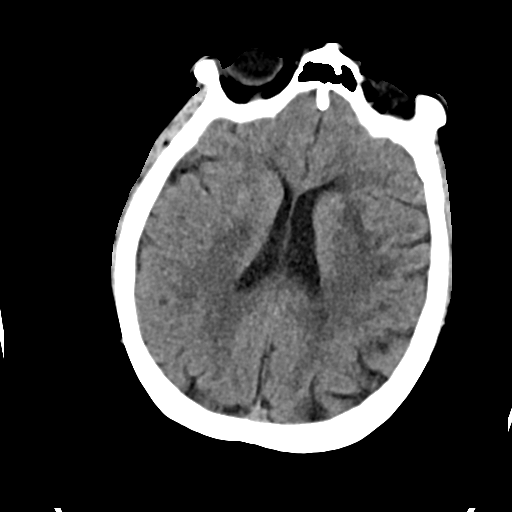
[im 21/33  bone]
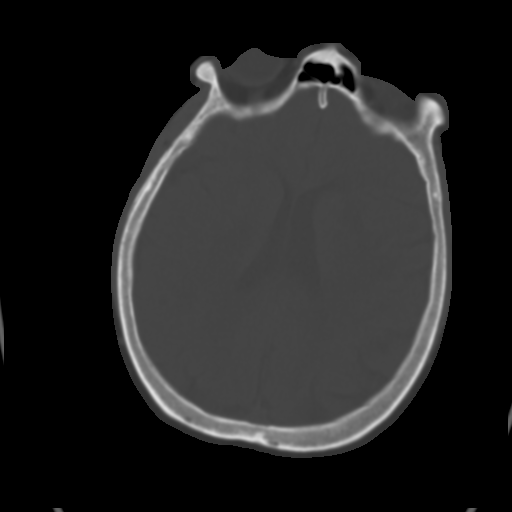
[im 25/33  brain]
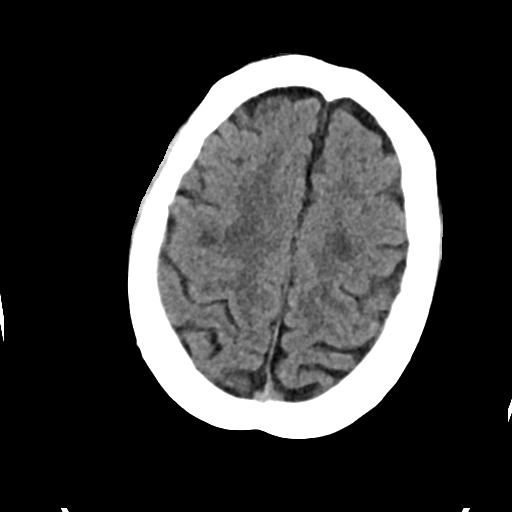
[im 29/33  brain]
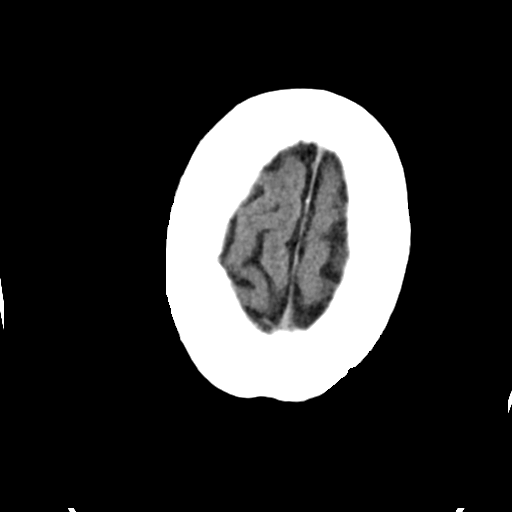

[Series 5: head bone · axial · 0.40mm/px · z∈[+1096,+1128]mm · 3 of 81 slices shown]
[im 9/81  bone]
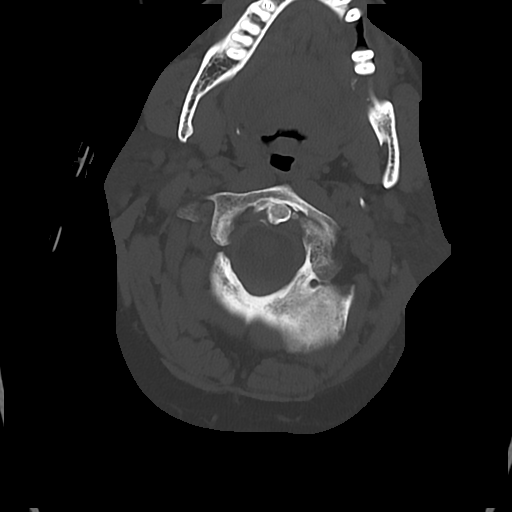
[im 17/81  bone]
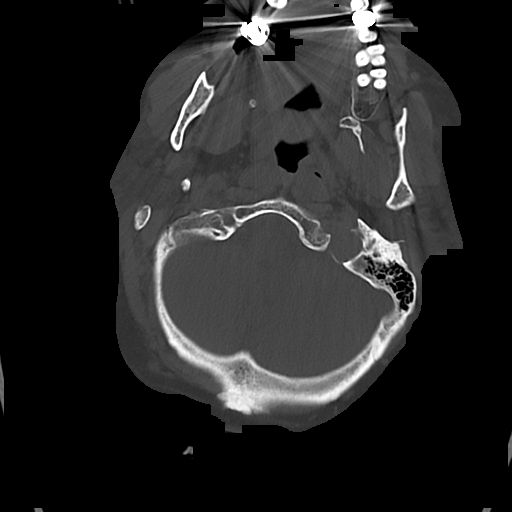
[im 25/81  bone]
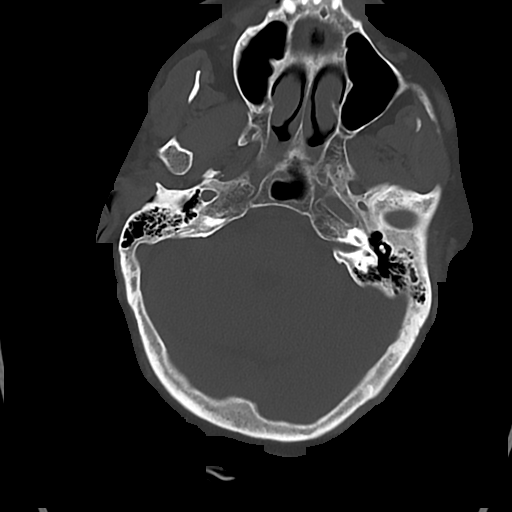

[Series 6: cor soft · coronal · 0.35mm/px · 3 of 68 slices shown]
[im 23/68  brain]
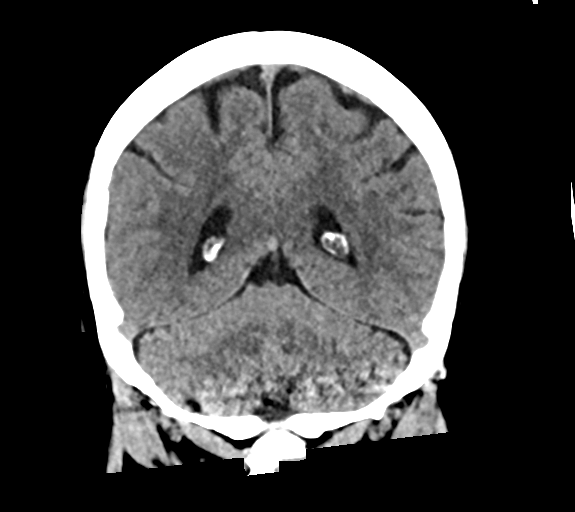
[im 30/68  brain]
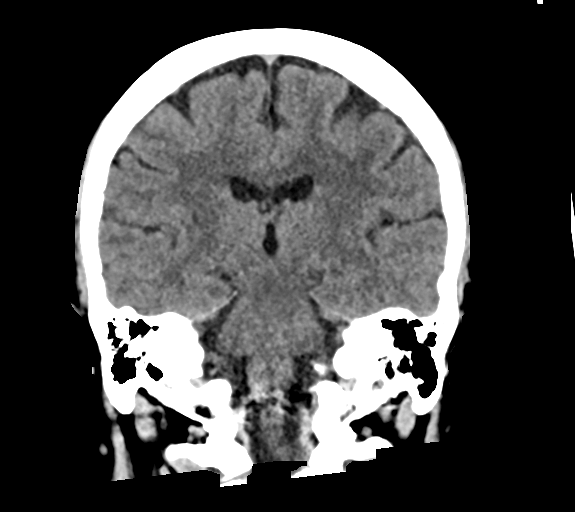
[im 38/68  brain]
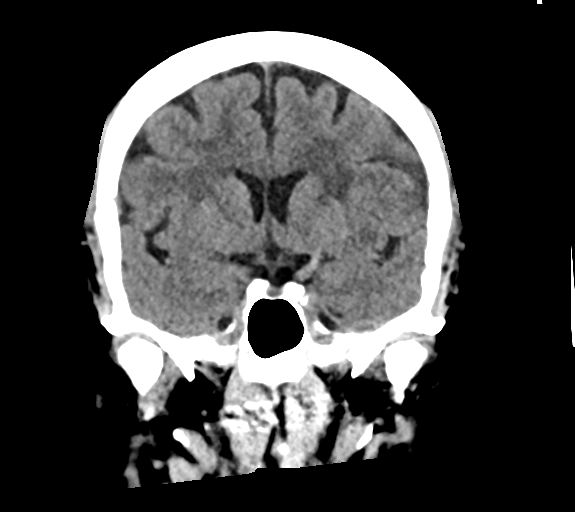

[Series 7: sag soft · sagittal · 0.34mm/px · 3 of 52 slices shown]
[im 18/52  brain]
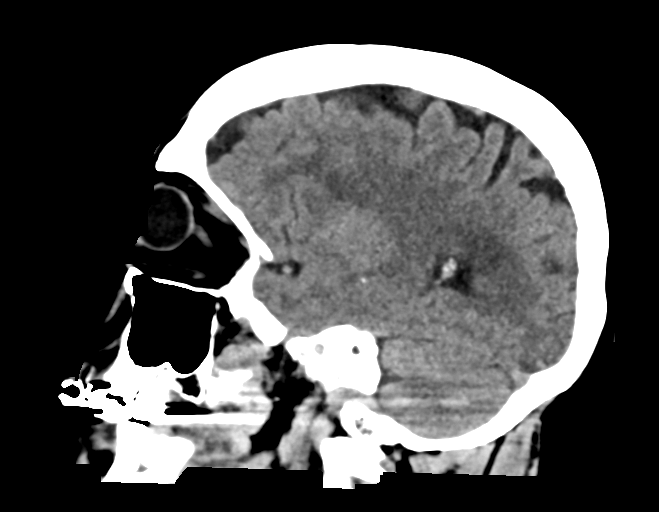
[im 26/52  brain]
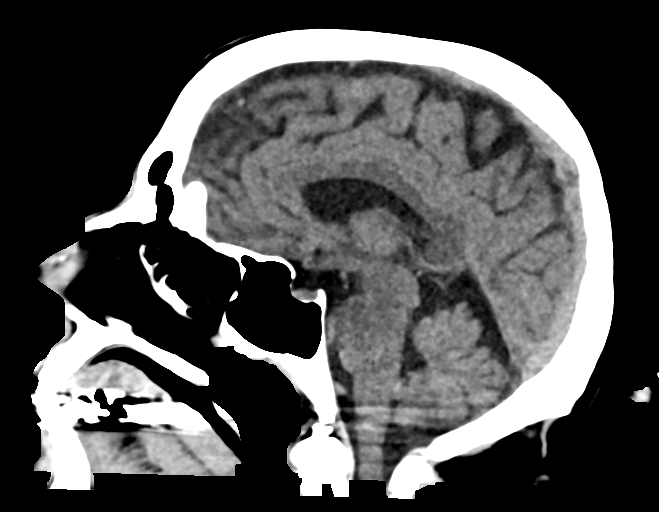
[im 35/52  brain]
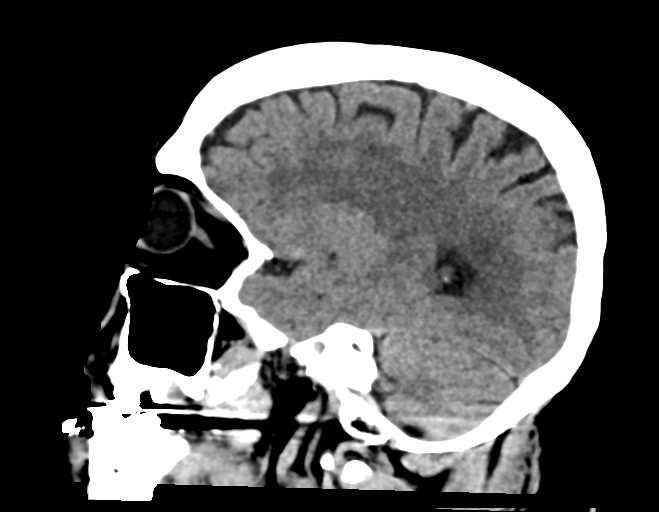

[16 of 47 positions shown; findings below may reference images not displayed]

FINDINGS: Brain: No evidence of acute large vascular territory infarction,
acute hemorrhage, hydrocephalus, extra-axial collection or mass
lesion/mass effect. There is moderate patchy white matter
hypoattenuation. Evaluation of the posterior fossa is limited by
extensive streak artifact from dental amalgam.

Vascular: No hyperdense vessel identified. Calcific atherosclerosis.

Skull: No acute fracture.

Sinuses/Orbits: Visualized sinuses are largely clear. Unremarkable
orbits.

Other: Periapical lucency of a left mandibular molar.

ASPECTS (Alberta Stroke Program Early CT Score)

- Ganglionic level infarction (caudate, lentiform nuclei, internal
capsule, insula, M1-M3 cortex):

- Supraganglionic infarction (M4-M6 cortex):

Total score (0-10 with 10 being normal):
IMPRESSION: 1. No evidence of acute large vascular territory infarct or acute
hemorrhage. ASPECTS is 10
2. Moderate/age advanced patchy white matter hypoattenuation, most
likely chronic microvascular ischemic disease.

Code stroke imaging results were communicated on [DATE] at [DATE] to provider Dr. PANGWE via secure text paging.

## 2020-09-17 IMAGING — CT CT ANGIO HEAD
2 of 9 series · 17 of 47 positions shown · IV contrast (APPLIED)
Comparison: None.

CLINICAL DATA: Neuro deficit, acute stroke suspected.

EXAM:
CT ANGIOGRAPHY HEAD AND NECK
TECHNIQUE: Multidetector CT imaging of the head and neck was performed using
the standard protocol during bolus administration of intravenous
contrast. Multiplanar CT image reconstructions and MIPs were
obtained to evaluate the vascular anatomy. Carotid stenosis
measurements (when applicable) are obtained utilizing NASCET
criteria, using the distal internal carotid diameter as the
denominator.
CONTRAST:  50mL OMNIPAQUE IOHEXOL 350 MG/ML SOLN

[Series 7: headangio 1.0 mpr ax · axial · 0.52mm/px · z∈[+949,+1217]mm · 14 of 311 slices shown]
[im 21/311  brain]
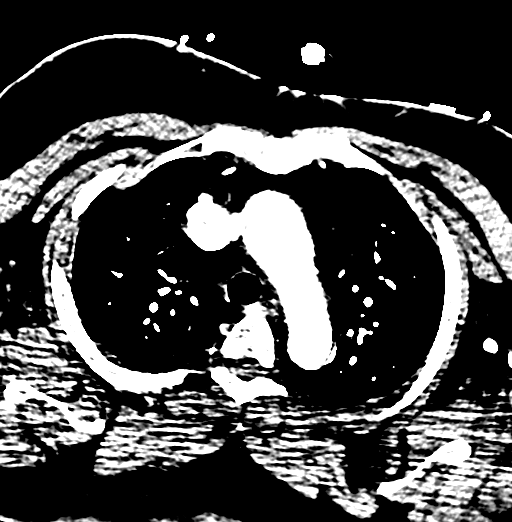
[im 42/311  bone]
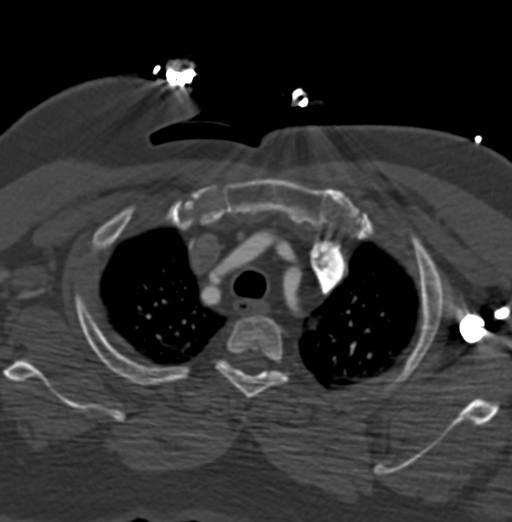
[im 63/311  brain]
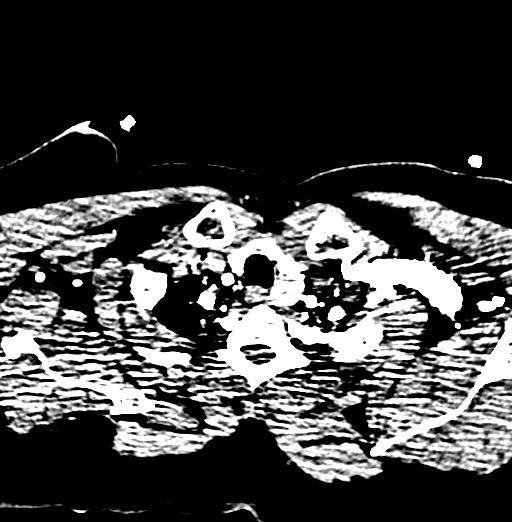
[im 83/311  bone]
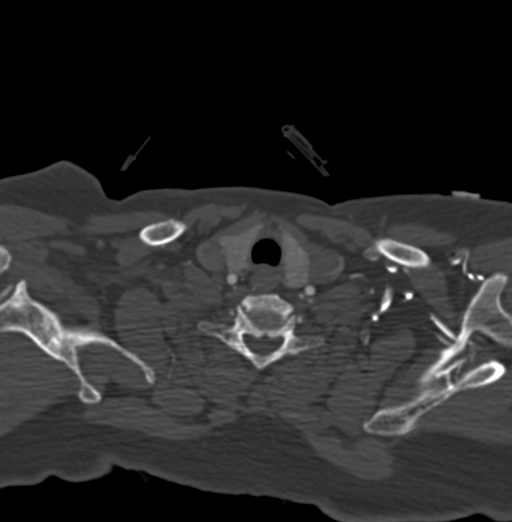
[im 104/311  brain]
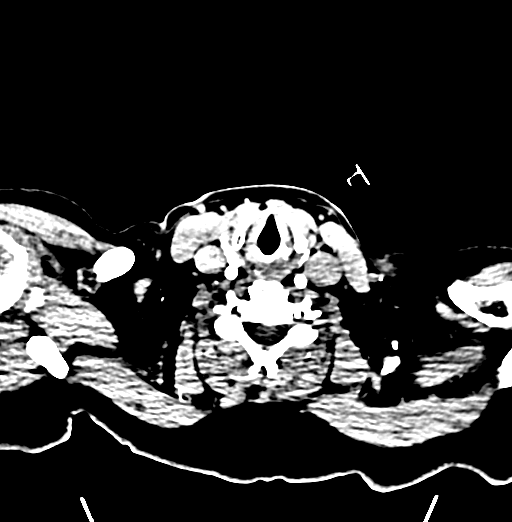
[im 125/311  bone]
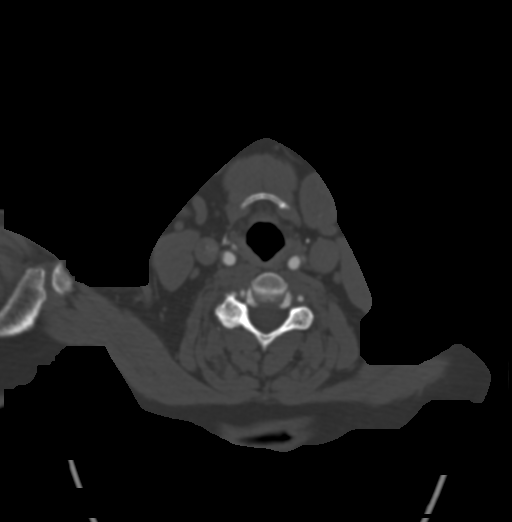
[im 145/311  brain]
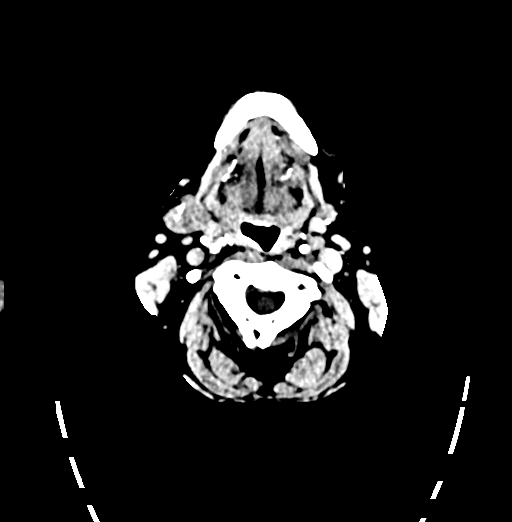
[im 166/311  bone]
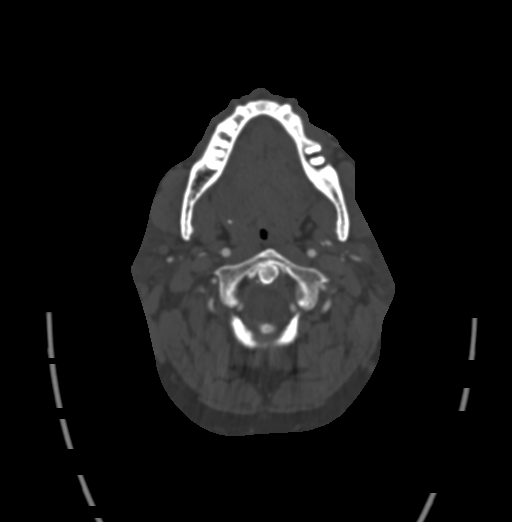
[im 187/311  brain]
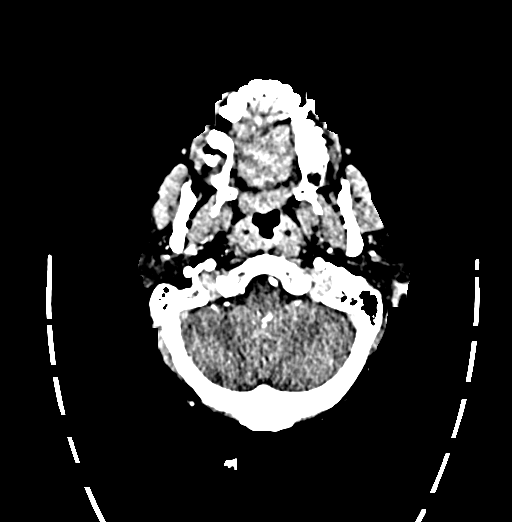
[im 207/311  bone]
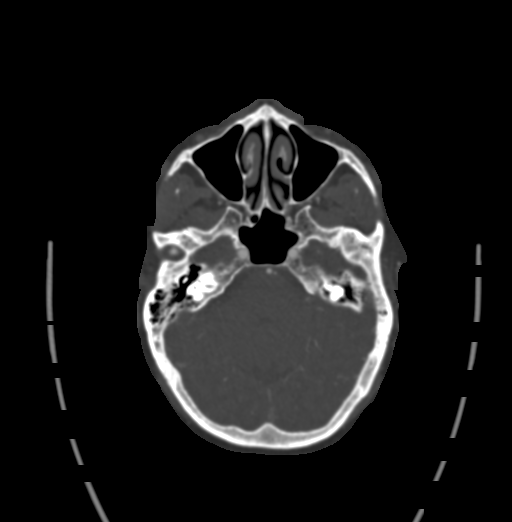
[im 228/311  brain]
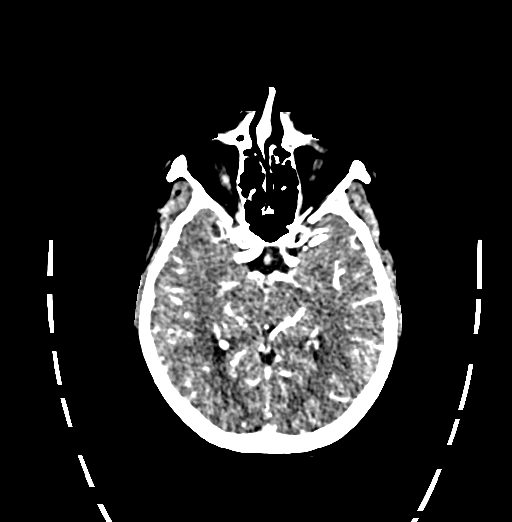
[im 249/311  bone]
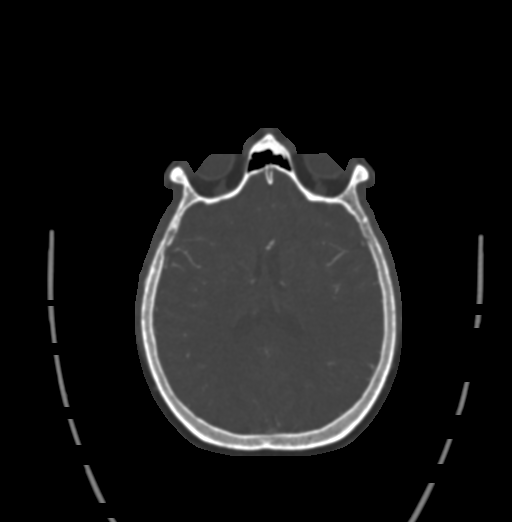
[im 269/311  brain]
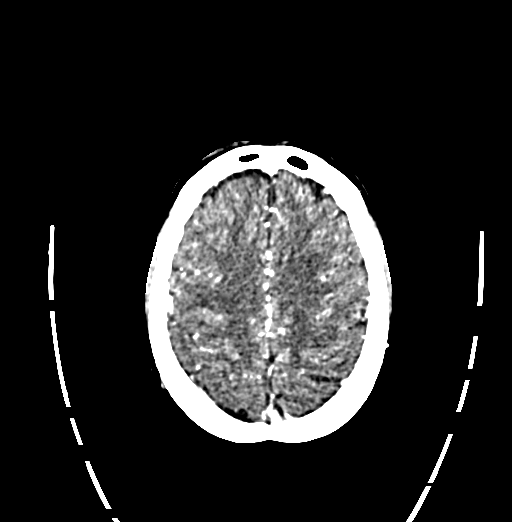
[im 290/311  bone]
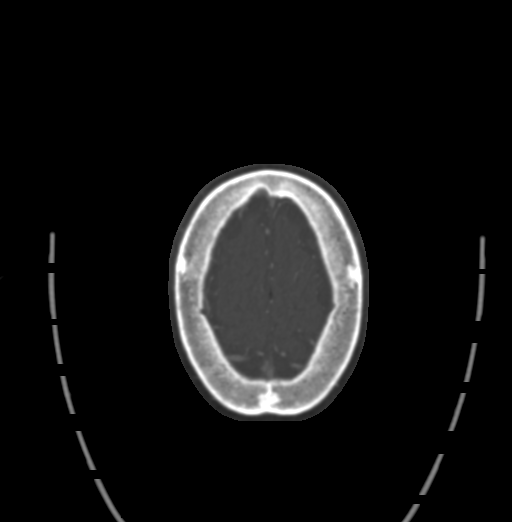

[Series 8: headangio 1.0 mpr cor · coronal · 0.51mm/px · 3 of 214 slices shown]
[im 61/214  brain]
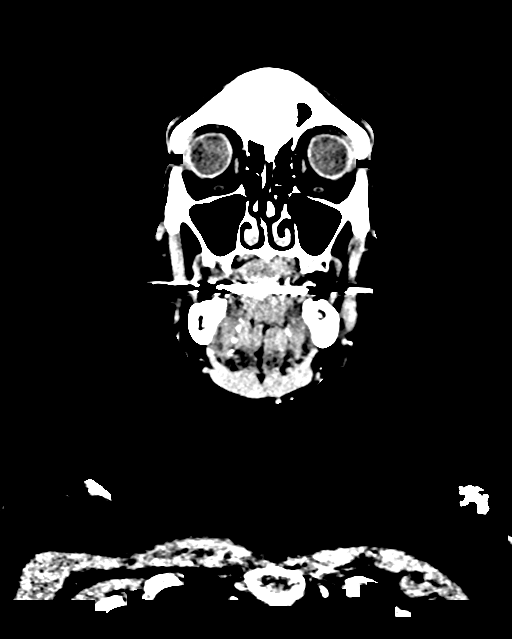
[im 92/214  brain]
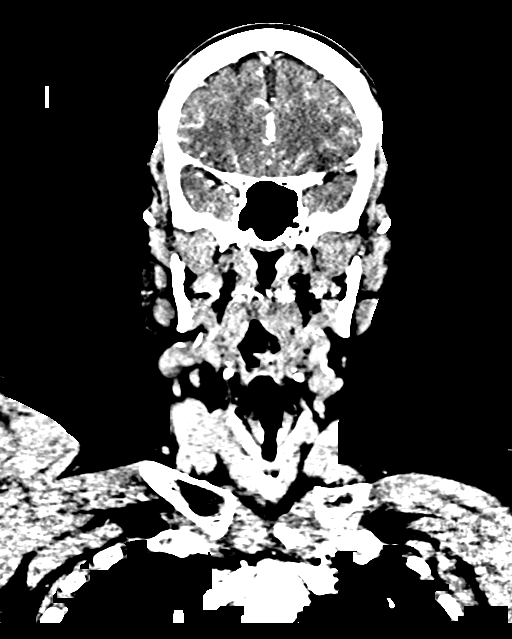
[im 122/214  brain]
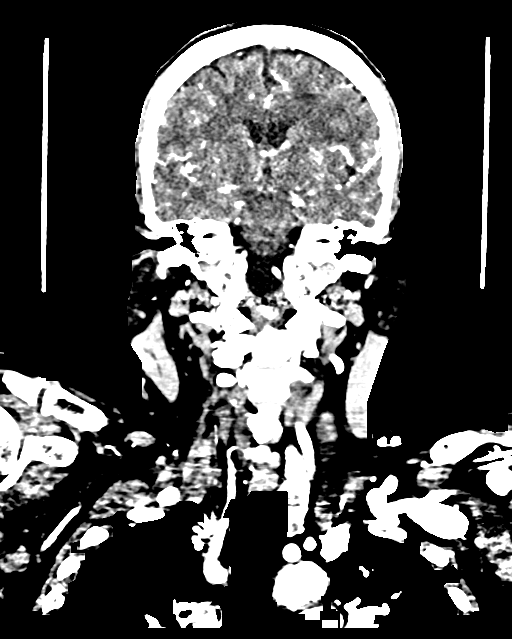

[17 of 47 positions shown; findings below may reference images not displayed]

FINDINGS: CTA NECK FINDINGS

Aortic arch: Atherosclerosis. Great vessel origins are patent.
Approximately 40% narrowing of the left subclavian artery origin.

Right carotid system: Mild mixed atherosclerosis at the carotid
bifurcation without significant (greater than 50%) stenosis.

Left carotid system: Mixed atherosclerosis at the carotid
bifurcation without significant (greater than 50%) stenosis.

Vertebral arteries: Codominant. No evidence of dissection, stenosis
(50% or greater) or occlusion.

Skeleton: Please see concurrent CT of the cervical spine for
evaluation of the cervical spine.

Other neck: Heterogeneous 1.8 cm left thyroid nodule.

Upper chest: Motion limited evaluation without consolidation or
definite acute abnormality.

Review of the MIP images confirms the above findings

CTA HEAD FINDINGS

Anterior circulation: No large vessel occlusion or proximal
hemodynamically significant stenosis. No aneurysm.

Posterior circulation: Small vertebrobasilar system with bilateral
fetal like PCAs, anatomic variant. No large vessel occlusion or
proximal hemodynamically significant stenosis. No aneurysm.

Venous sinuses: Limited evaluation due to arterial timing without
obvious dural sinus thrombosis. Small right transverse sinus.

Anatomic variants: Described above.

Review of the MIP images confirms the above findings
IMPRESSION: CTA Head:

No large vessel occlusion or proximal hemodynamically significant
stenosis.

CTA Neck:

1. No significant stenosis in the neck.
2. Approximately 40% narrowing of the left subclavian artery origin.
3. Heterogeneous 1.8 cm left thyroid nodule. Recommend non urgent
thyroid ultrasound to further evaluate.
4. Please see concurrent CT of the cervical spine for evaluation of
the spine.

## 2020-09-17 IMAGING — CT CT CERVICAL SPINE W/O CM
5 series · 16 of 33 positions shown, 18 images · IV contrast (APPLIED)
Comparison: Same-day CT angiogram head/neck [DATE].

CLINICAL DATA: Trauma.  Neck trauma.

EXAM:
CT CERVICAL SPINE WITHOUT CONTRAST
TECHNIQUE: Multidetector CT imaging of the cervical spine was performed without
intravenous contrast. Multiplanar CT image reconstructions were also
generated.

[Series 13: c spine bone · axial · 0.27mm/px · z∈[+1035,+1107]mm · 3 of 74 slices shown, 4 images]
[im 19/74  soft-tissue]
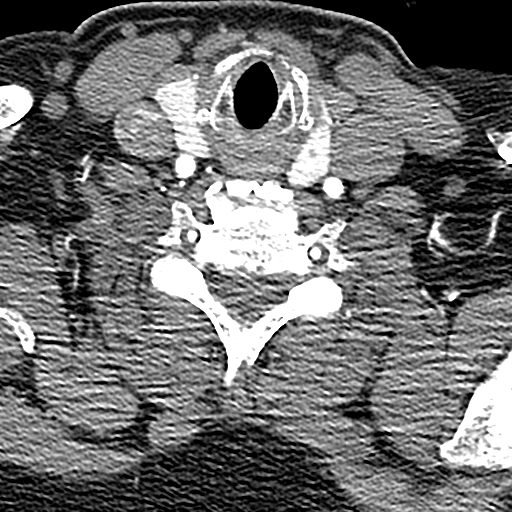
[im 19/74  bone]
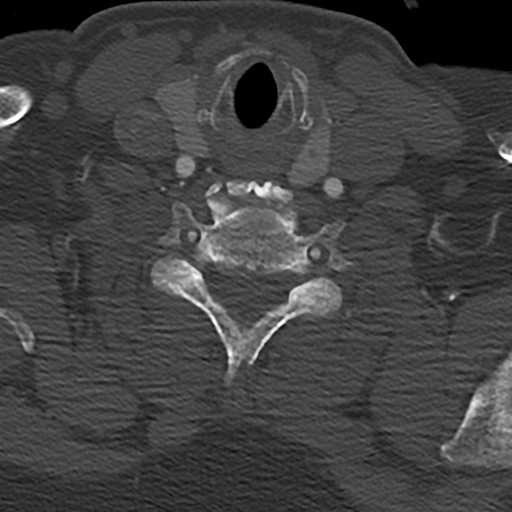
[im 37/74  bone]
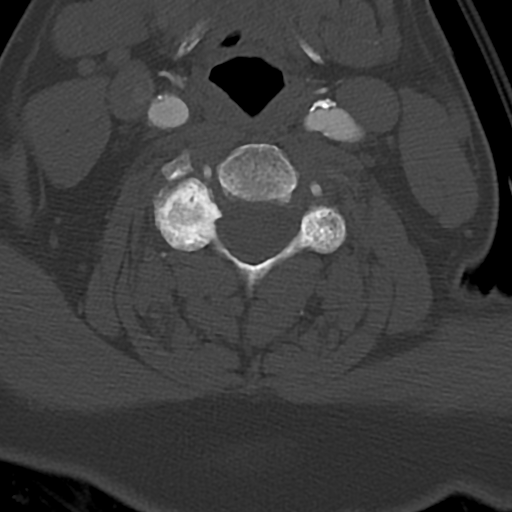
[im 55/74  bone]
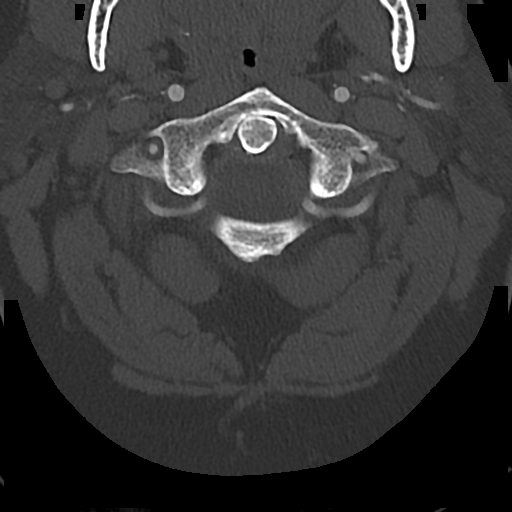

[Series 14: c spine st · axial · 0.27mm/px · z∈[+1047,+1095]mm · 2 of 74 slices shown]
[im 25/74  bone]
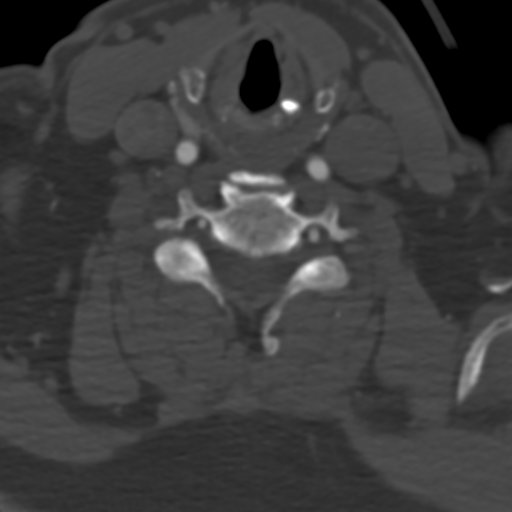
[im 49/74  bone]
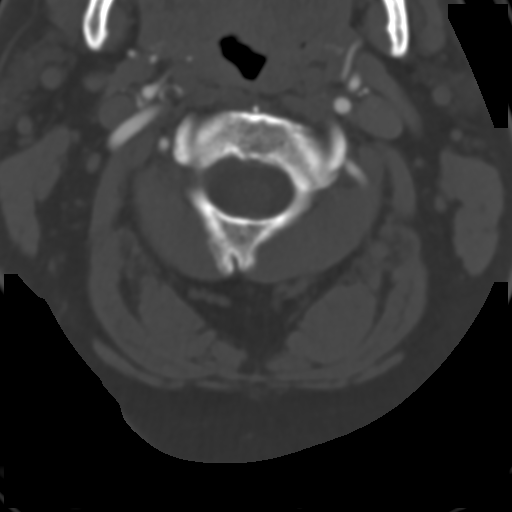

[Series 602: axial · axial · 0.29mm/px · z∈[+999,+1074]mm · 3 of 83 slices shown]
[im 21/83  bone]
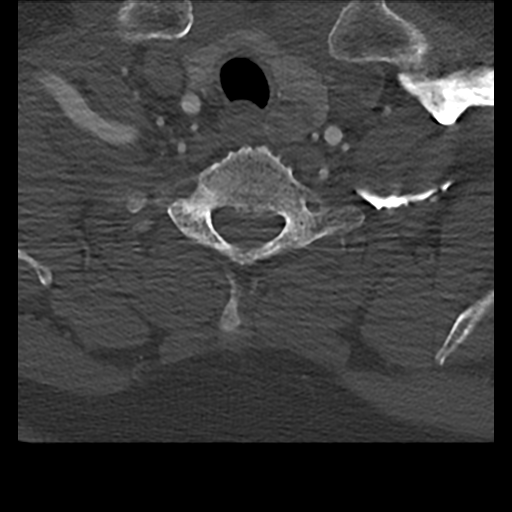
[im 42/83  bone]
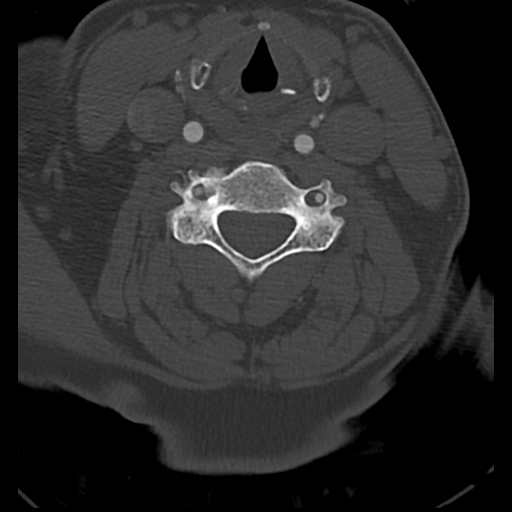
[im 62/83  bone]
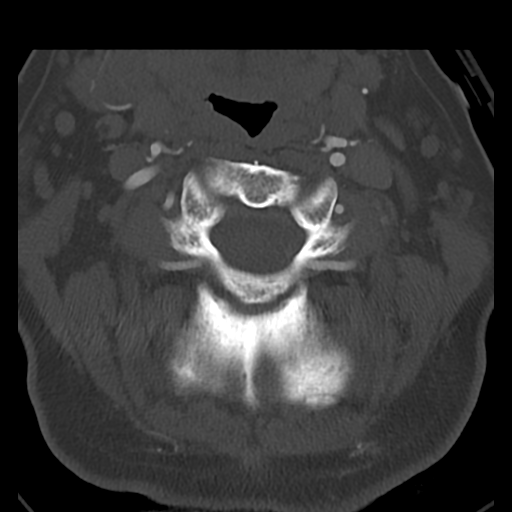

[Series 603: cor · coronal · 0.29mm/px · 3 of 34 slices shown]
[im 7/34  bone]
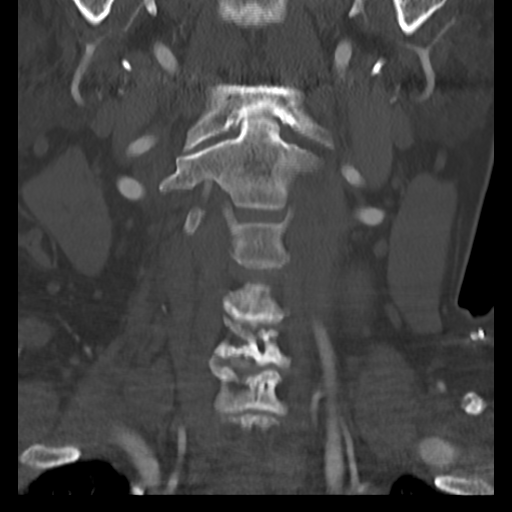
[im 14/34  bone]
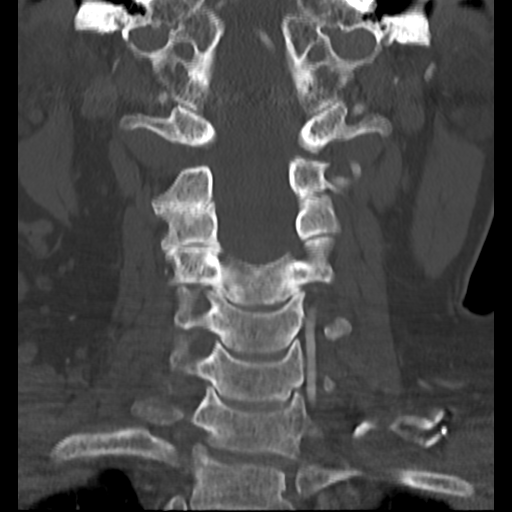
[im 20/34  bone]
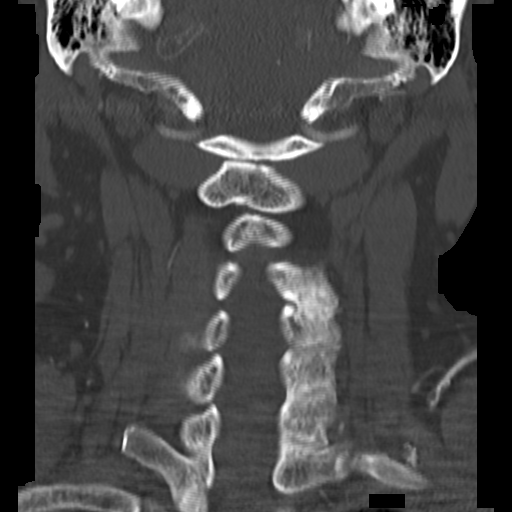

[Series 604: sag · sagittal · 0.29mm/px · 5 of 38 slices shown, 6 images]
[im 13/38  bone]
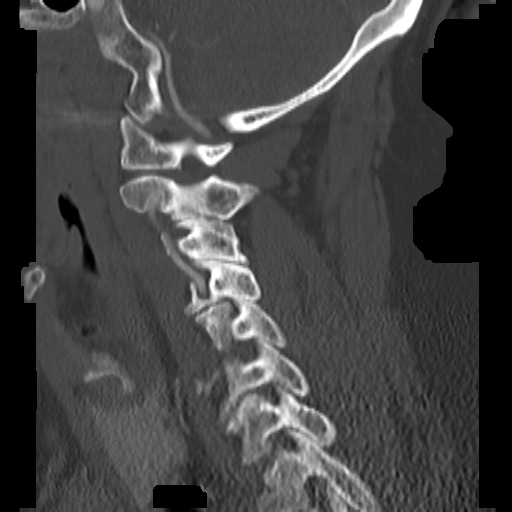
[im 16/38  bone]
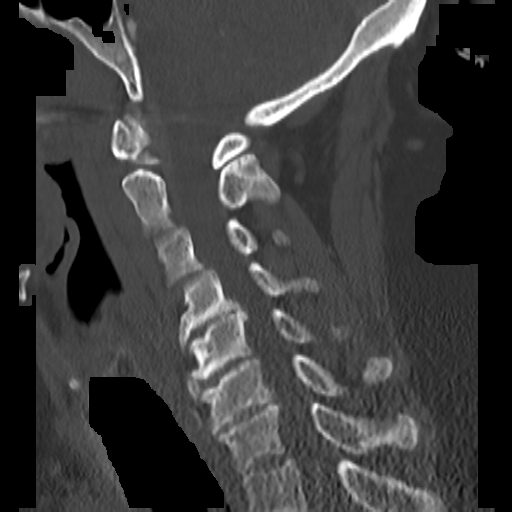
[im 19/38  soft-tissue]
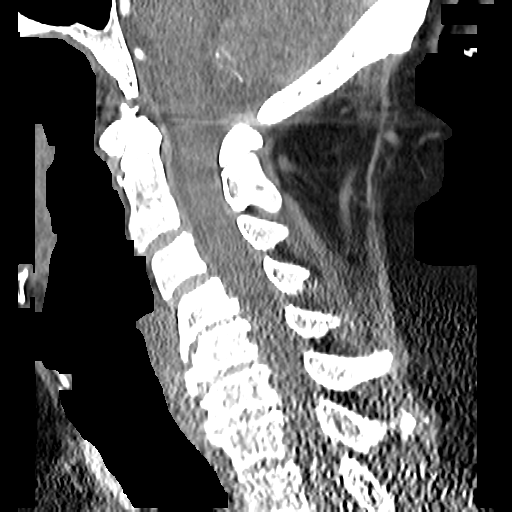
[im 19/38  bone]
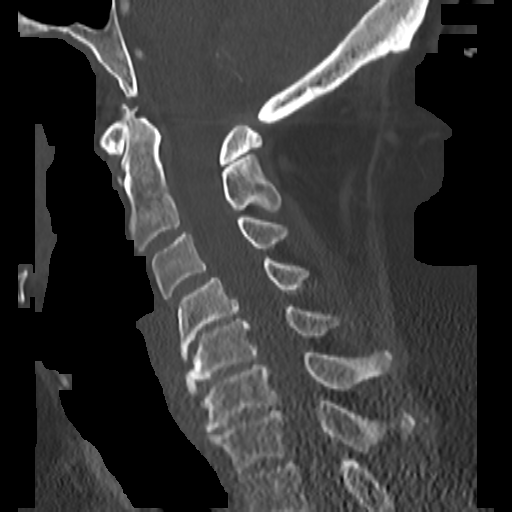
[im 22/38  bone]
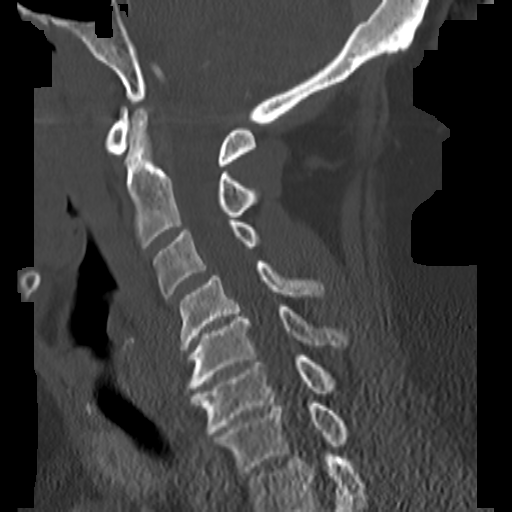
[im 25/38  bone]
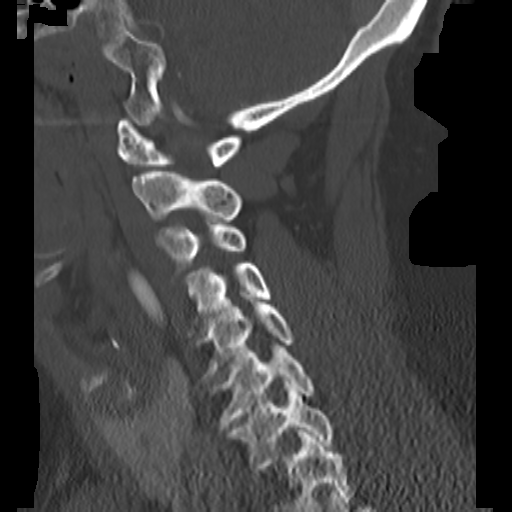

[16 of 33 positions shown; findings below may reference images not displayed]

FINDINGS: Alignment: Reversal of the expected cervical lordosis. Cervical
levocurvature. 2 mm C2-C3 and C3-C4 grade 1 anterolisthesis.

Skull base and vertebrae: The basion-dental and atlanto-dental
intervals are maintained.No evidence of acute fracture to the
cervical spine.

Soft tissues and spinal canal: No prevertebral fluid or swelling. No
visible canal hematoma.

Disc levels: Cervical spondylosis with multilevel disc space
narrowing, disc bulges, endplate spurring, uncovertebral hypertrophy
and facet arthrosis. Disc space narrowing is greatest at C4-C5,
C5-C6 and C6-C7 (moderate/advanced at these levels). No appreciable
high-grade spinal canal stenosis. Multilevel bony neural foraminal
narrowing.

Upper chest: No consolidation within the imaged lung apices. No
visible pneumothorax.

Other: Multiple thyroid nodules, the largest within the left lobe
measuring 2.2 cm.
IMPRESSION: No evidence of acute fracture to the cervical spine.

Nonspecific reversal of the expected cervical lordosis.

Cervical levocurvature.

C2-C3 and C3-C4 grade 1 anterolisthesis.

Cervical spondylosis, as described.

Multiple thyroid nodules, the largest within the left lobe measuring
2.2 cm. Non-emergent thyroid ultrasound recommended for further
evaluation.

## 2020-09-17 IMAGING — CT CT CERVICAL SPINE W/O CM
1 series · 1 of 1 positions shown · non-contrast
Comparison: Same-day CT angiogram head/neck [DATE].

CLINICAL DATA: Trauma.  Neck trauma.

EXAM:
CT CERVICAL SPINE WITHOUT CONTRAST
TECHNIQUE: Multidetector CT imaging of the cervical spine was performed without
intravenous contrast. Multiplanar CT image reconstructions were also
generated.

[Series 2: topogram 1.0 tr20 · coronal · 4.00mm/px · 1 of 1 slices shown]
[im 1/1]
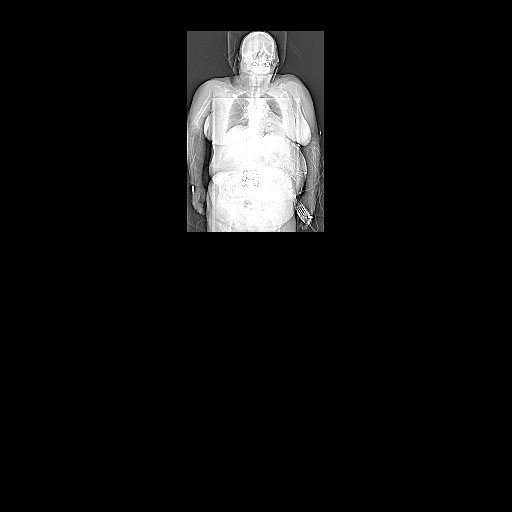

[1 of 1 positions shown; findings below may reference images not displayed]

FINDINGS: Alignment: Reversal of the expected cervical lordosis. Cervical
levocurvature. 2 mm C2-C3 and C3-C4 grade 1 anterolisthesis.

Skull base and vertebrae: The basion-dental and atlanto-dental
intervals are maintained.No evidence of acute fracture to the
cervical spine.

Soft tissues and spinal canal: No prevertebral fluid or swelling. No
visible canal hematoma.

Disc levels: Cervical spondylosis with multilevel disc space
narrowing, disc bulges, endplate spurring, uncovertebral hypertrophy
and facet arthrosis. Disc space narrowing is greatest at C4-C5,
C5-C6 and C6-C7 (moderate/advanced at these levels). No appreciable
high-grade spinal canal stenosis. Multilevel bony neural foraminal
narrowing.

Upper chest: No consolidation within the imaged lung apices. No
visible pneumothorax.

Other: Multiple thyroid nodules, the largest within the left lobe
measuring 2.2 cm.
IMPRESSION: No evidence of acute fracture to the cervical spine.

Nonspecific reversal of the expected cervical lordosis.

Cervical levocurvature.

C2-C3 and C3-C4 grade 1 anterolisthesis.

Cervical spondylosis, as described.

Multiple thyroid nodules, the largest within the left lobe measuring
2.2 cm. Non-emergent thyroid ultrasound recommended for further
evaluation.

## 2020-09-17 IMAGING — MR MR HEAD W/O CM
6 series · 46 of 48 positions shown · non-contrast
Comparison: CT studies same day.  MRI [DATE].

CLINICAL DATA: Acute stroke presentation. Left-sided weakness.
Negative acute CT evaluation.

EXAM:
MRI HEAD WITHOUT CONTRAST
TECHNIQUE: Multiplanar, multiecho pulse sequences of the brain and surrounding
structures were obtained without intravenous contrast.

[Series 3: DWI · axial · 3.0mm · 1.09mm/px · z∈[-61,+70]mm · 13 of 90 slices shown (1 of 4)]
[im 1/90]
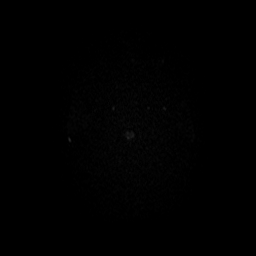
[im 7/90]
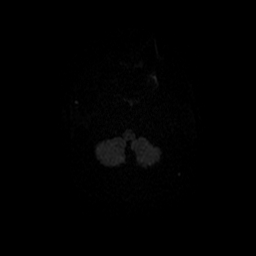
[im 13/90]
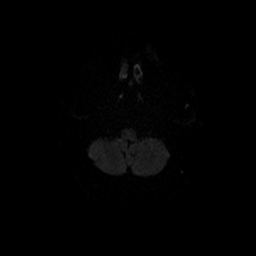
[im 20/90]
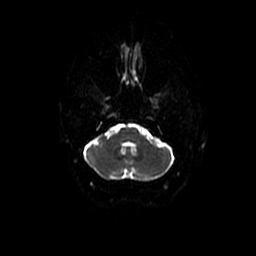
[im 26/90]
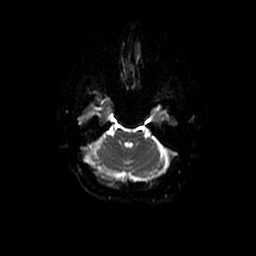
[im 32/90]
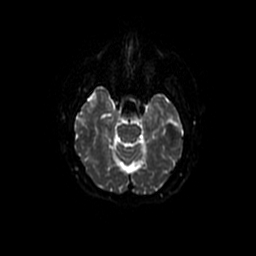
[im 39/90]
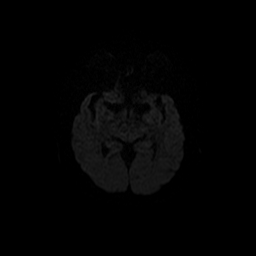
[im 45/90]
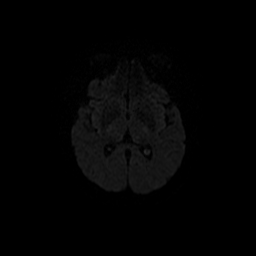
[im 51/90]
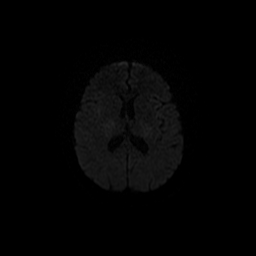
[im 58/90]
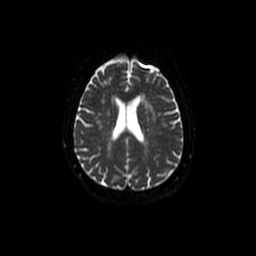
[im 64/90]
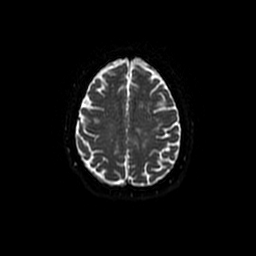
[im 77/90]
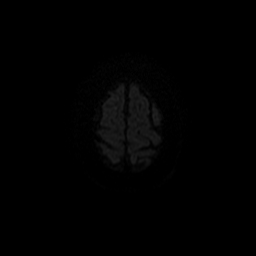
[im 90/90]
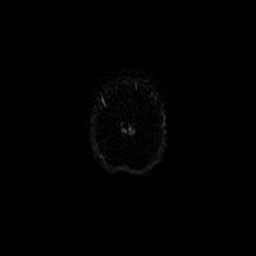

[Series 4: DWI · coronal · 5.0mm · 1.09mm/px · 11 of 66 slices shown (2 of 4)]
[im 1/66]
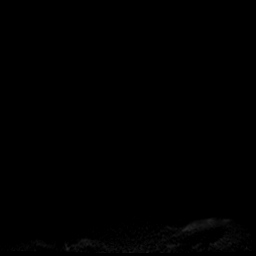
[im 7/66]
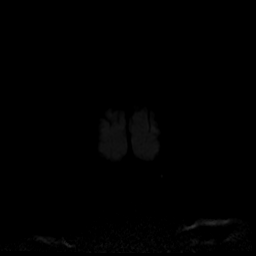
[im 14/66]
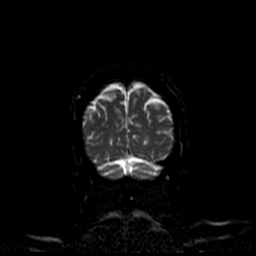
[im 20/66]
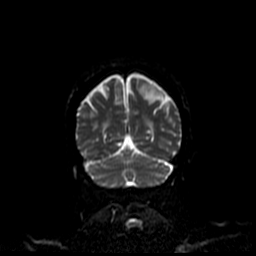
[im 27/66]
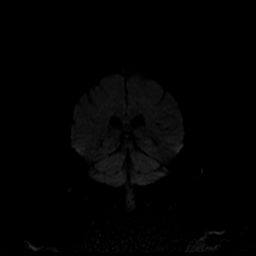
[im 33/66]
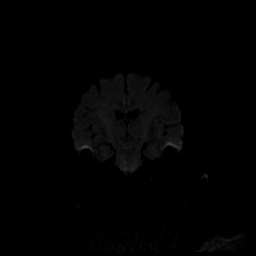
[im 40/66]
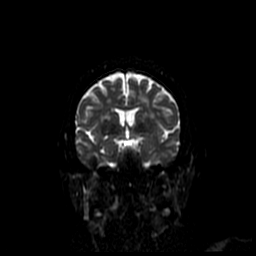
[im 46/66]
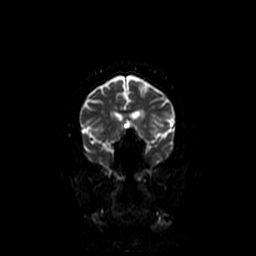
[im 53/66]
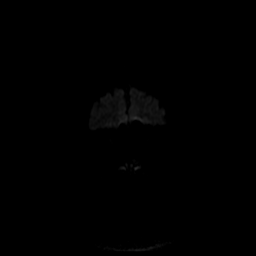
[im 59/66]
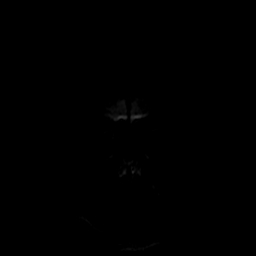
[im 66/66]
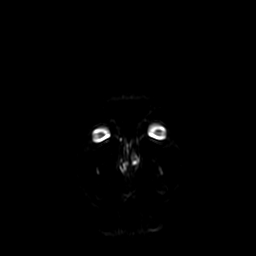

[Series 5: FLAIR · axial · 3.0mm · 0.43mm/px · z∈[-69,+67]mm · 4 of 24 slices shown]
[im 1/24]
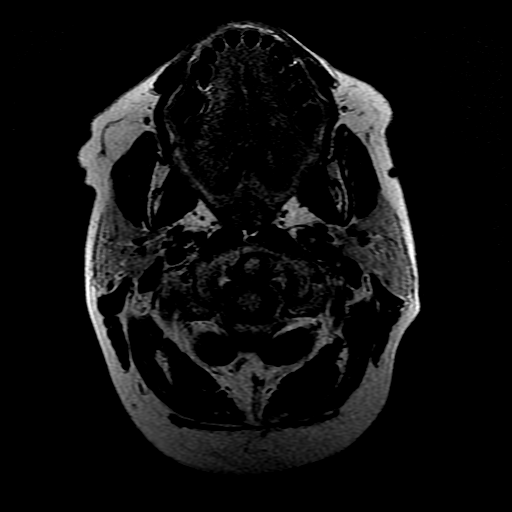
[im 8/24]
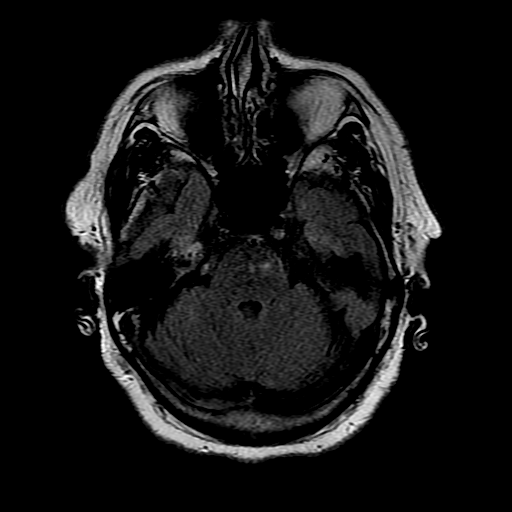
[im 16/24]
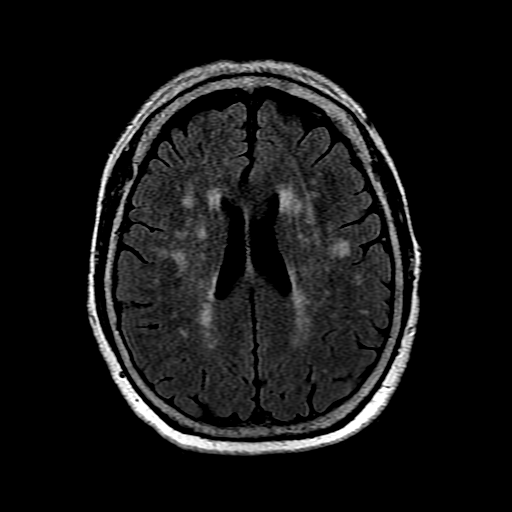
[im 24/24]
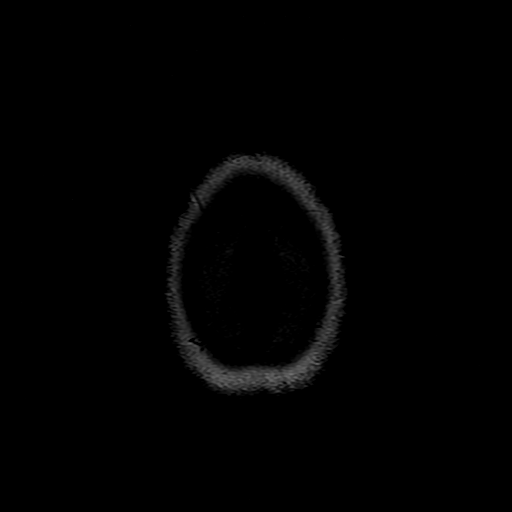

[Series 6: ax mpgr · axial · 5.0mm · 0.43mm/px · z∈[-70,+68]mm · 4 of 21 slices shown]
[im 1/21]
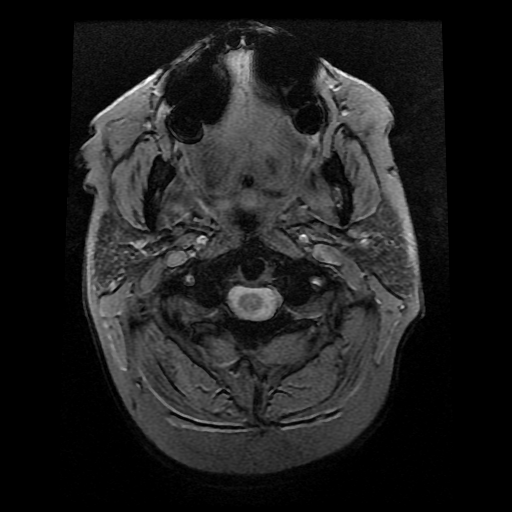
[im 7/21]
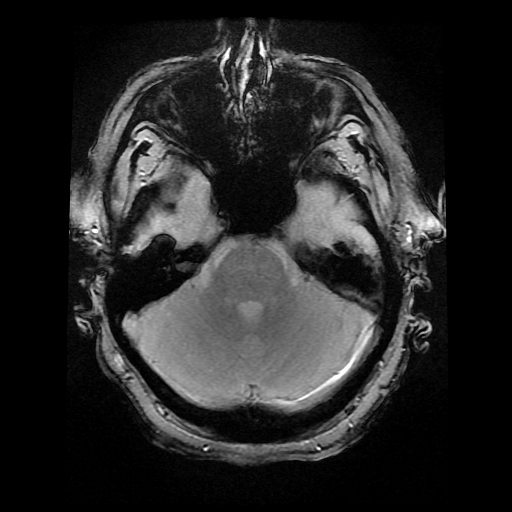
[im 14/21]
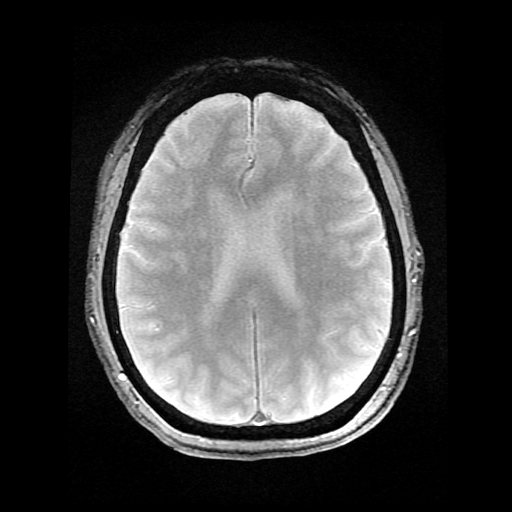
[im 21/21]
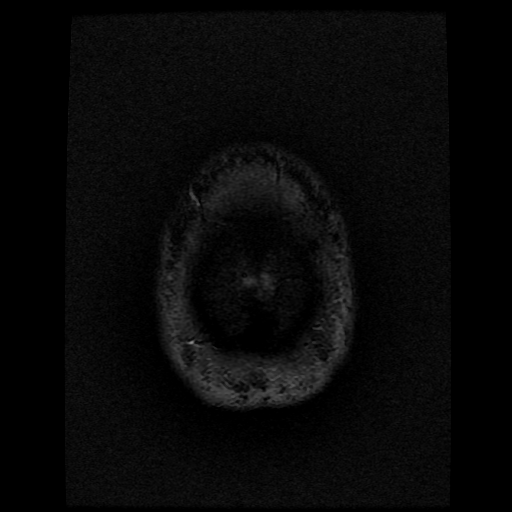

[Series 300: DWI · axial · 3.0mm · 1.09mm/px · z∈[-61,+70]mm · 8 of 45 slices shown (3 of 4)]
[im 1/45]
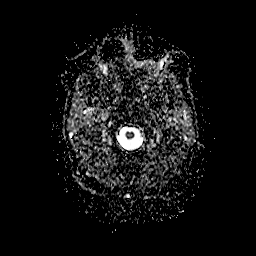
[im 7/45]
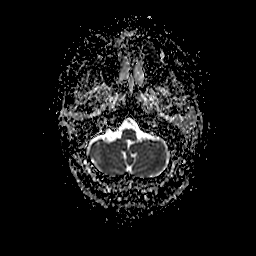
[im 13/45]
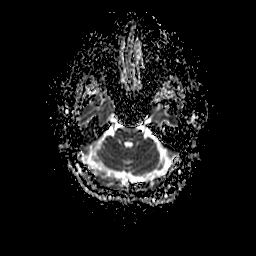
[im 19/45]
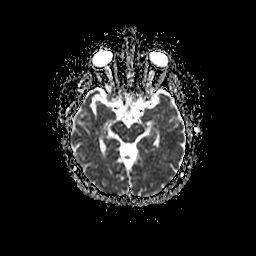
[im 26/45]
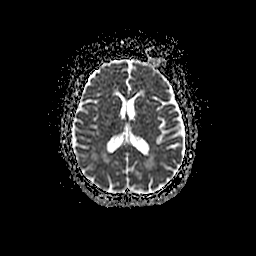
[im 32/45]
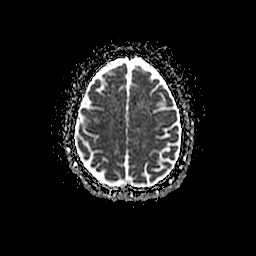
[im 38/45]
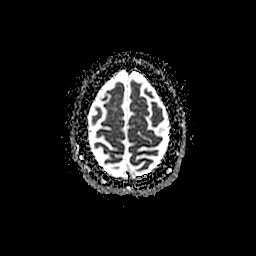
[im 45/45]
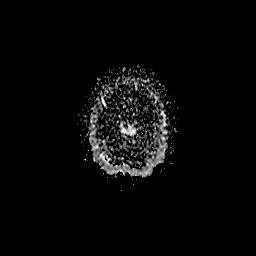

[Series 400: DWI · coronal · 5.0mm · 1.09mm/px · 6 of 33 slices shown (4 of 4)]
[im 1/33]
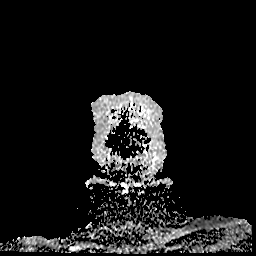
[im 7/33]
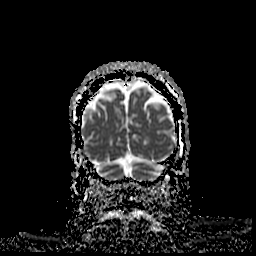
[im 13/33]
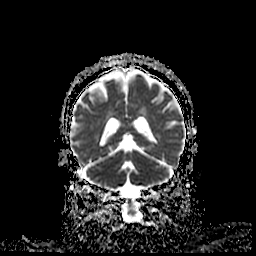
[im 20/33]
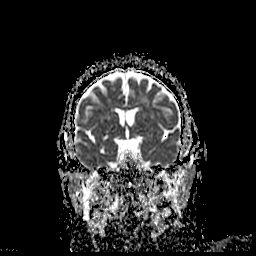
[im 26/33]
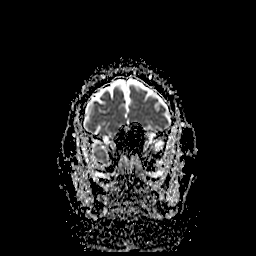
[im 33/33]
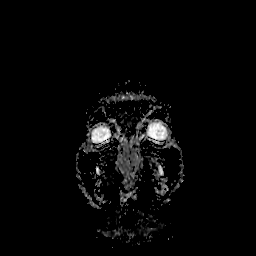

[46 of 48 positions shown; findings below may reference images not displayed]

FINDINGS: Brain: Diffusion imaging does not show any acute or subacute
infarction. Chronic small-vessel ischemic changes affect the pons.
No focal cerebellar insult. Cerebral hemispheres show moderate
chronic small-vessel ischemic changes of the deep and subcortical
white matter, similar to the study [DATE]. No large vessel
territory infarction. No mass, hemorrhage, hydrocephalus or
extra-axial collection.

Vascular: Major vessels at the base of the brain show flow.

Skull and upper cervical spine: Negative

Sinuses/Orbits: Clear/normal

Other: None
IMPRESSION: No acute finding. Moderate chronic small-vessel ischemic changes of
the pons and cerebral hemispheric white matter, similar to the study
[DATE].

## 2020-09-17 MED ORDER — IOHEXOL 350 MG/ML SOLN
50.0000 mL | Freq: Once | INTRAVENOUS | Status: AC | PRN
  Administered 2020-09-17: 50 mL via INTRAVENOUS

## 2020-09-17 NOTE — ED Provider Notes (Signed)
[facility] EMERGENCY DEPARTMENT Provider Note   CSN: [id number] Arrival date & time: 09/17/20  1257     History Chief Complaint  Patient presents with  . Loss of Consciousness  . Hypotension    Angelica Carter is a 65 y.o. female who presents via EMS after syncopal episode.  Patient states that she was sitting in her neighbors home when she suddenly became flushed, diaphoretic, and lightheaded.  She sat down and then syncopized, falling into the wall on her left side  and out of the chair.  The neighbor called her husband who helped the patient get up into a seat.  She came to however remains diaphoretic and lightheaded.  She was hypotensive for EMS when she arrived and was administered 500 cc of NS by EMS.  She also placed in c-collar for precautions.  Per EMS, the patient began to experience left-sided paresthesias and weakness during the ride.  Last known well was 1100 a.m.  Patient with history of TIA, not on any anticoagulation.  LEVEL 5 CAVEAT due to acuity of patient's presentation on arrival.  Personally reviewed this patient's medical records.  She has history of CKD, COPD, TIA, anxiety, depression, multiple back surgeries.  She is on Wellbutrin and trazodone, not on any anticoagulation.  Has not started any new medications.  History of syncope in the past with muscle relaxer medications.  HPI     Past Medical History:  Diagnosis Date  . ADHD (attention deficit hyperactivity disorder)    diagnosed in childhood; unspecified type but most likely inattentive  . Allergy   . Aortic atherosclerosis   . Arthritis   . Asthma   . Chronic kidney disease (CKD)    born with only one kidney  . COPD (chronic obstructive pulmonary disease)   . 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 Fibromyalgia   . Generalized anxiety disorder   . History of concussion   . Left foot drop 07/14/2017  . LLQ pain 02/08/2020  . Low back pain 07/14/2017  . Major depressive  disorder   . Obstructive sleep apnea    Not currently using CPAP machine as machine is broken; awaiting parts  . TIA (transient ischemic attack) 2009  . Tuberculosis     Patient Active Problem List   Diagnosis Date Noted  . Aortic atherosclerosis   . Generalized anxiety disorder   . Major depressive disorder   . ADHD (attention deficit hyperactivity disorder)   . TIA (transient ischemic attack)   . Fibromyalgia   . History of concussion   . Chronic kidney disease (CKD)   . COPD (chronic obstructive pulmonary disease)   . Obstructive sleep apnea   . LLQ pain 02/08/2020  . Diarrhea 02/08/2020  . Left foot drop 07/14/2017  . Low back pain 07/14/2017  . Degenerative lumbar spinal stenosis 08/10/2016    Past Surgical History:  Procedure Laterality Date  . BACK SURGERY     x 4   . TUBAL LIGATION       OB History   No obstetric history on file.     Family History  Problem Relation Age of Onset  . Colon cancer Mother 97  . Heart disease Father   . Colon polyps Sister   . Stomach cancer Neg Hx   . Pancreatic cancer Neg Hx   . Esophageal cancer Neg Hx     Social History   Tobacco Use  . Smoking status: Light Tobacco Smoker    Packs/day: 0.25  .  Smokeless tobacco: Never Used  . Tobacco comment: 1.5 packs every 2 weeks  Vaping Use  . Vaping Use: Never used  Substance Use Topics  . Alcohol use: Not Currently  . Drug use: Yes    Types: Marijuana    Comment: Couple nights per week to help with relaxation and sleep difficulties    Home Medications Prior to Admission medications   Medication Sig Start Date End Date Taking? Authorizing Provider  amLODipine (NORVASC) 10 MG tablet Take 10 mg by mouth daily.    [provider]  buPROPion (WELLBUTRIN XL) 150 MG 24 hr tablet Take 150 mg by mouth daily. 08/17/19   [provider]  dicyclomine (BENTYL) 20 MG tablet Take 1 tablet (20 mg total) by mouth 3 (three) times daily as needed for spasms. 04/02/20    Armbruster, Carlota Raspberry, MD  DIFLUCAN 150 MG tablet Take by mouth daily. 02/07/20   [provider]  HYDROcodone-acetaminophen (NORCO/VICODIN) 5-325 MG tablet Take 1 tablet by mouth 2 (two) times daily. 01/24/20   [provider]  ondansetron (ZOFRAN) 4 MG tablet Take 4 mg by mouth 3 (three) times daily as needed. 10/27/19   [provider]  pantoprazole (PROTONIX) 40 MG tablet Take 40 mg by mouth daily. 10/27/19   [provider]  pregabalin (LYRICA) 200 MG capsule Take 200 mg by mouth 3 (three) times daily.    [provider]  SENNOSIDES-DOCUSATE SODIUM PO Take by mouth.    [provider]  SUMAtriptan (IMITREX) 25 MG tablet Take 25 mg by mouth every 2 (two) hours as needed for migraine. May repeat in 2 hours if headache persists or recurs.    [provider]  SYMBICORT 80-4.5 MCG/ACT inhaler Inhale 1 puff into the lungs 2 (two) times daily. 08/17/19   [provider]  traZODone (DESYREL) 50 MG tablet Take 75 mg by mouth at bedtime.    [provider]    Allergies    Cyclobenzaprine  Review of Systems   Review of Systems  Unable to perform ROS: Acuity of condition  Neurological: Positive for syncope and weakness.    Physical Exam Updated Vital Signs BP 118/81   Pulse 71   Temp 98 F (36.7 C) (Oral)   Resp 18   Wt 76.3 kg   SpO2 97%   BMI 33.97 kg/m   Physical Exam Vitals and nursing note reviewed.  Constitutional:      Appearance: She is obese. She is ill-appearing. She is not toxic-appearing.     Interventions: Cervical collar in place.  HENT:     Head: Normocephalic and atraumatic.     Nose: Nose normal.     Mouth/Throat:     Mouth: Mucous membranes are moist.     Pharynx: Oropharynx is clear. Uvula midline. No oropharyngeal exudate, posterior oropharyngeal erythema or uvula swelling.     Tonsils: No tonsillar exudate.  Eyes:     General: Lids are normal. Vision grossly intact.        Right eye:  No discharge.        Left eye: No discharge.     Extraocular Movements: Extraocular movements intact.     Conjunctiva/sclera: Conjunctivae normal.     Pupils: Pupils are equal, round, and reactive to light.  Neck:     Trachea: Trachea and phonation normal.     Comments: C-spine cleared by this provider following results of normal CT C-spine. Cardiovascular:     Rate and Rhythm: Normal rate  and regular rhythm.     Pulses: Normal pulses.     Heart sounds: Normal heart sounds. No murmur heard.   Pulmonary:     Effort: Pulmonary effort is normal. No tachypnea, bradypnea, accessory muscle usage, prolonged expiration or respiratory distress.     Breath sounds: Normal breath sounds. No wheezing or rales.  Chest:     Chest wall: No mass, lacerations, deformity, swelling, tenderness, crepitus or edema.  Abdominal:     General: Bowel sounds are normal. There is no distension.     Palpations: Abdomen is soft.     Tenderness: There is no abdominal tenderness. There is no right CVA tenderness, left CVA tenderness, guarding or rebound.  Musculoskeletal:        General: No deformity.     Cervical back: Normal range of motion. No edema, rigidity or crepitus. No pain with movement, spinous process tenderness or muscular tenderness.       Back:     Right lower leg: No edema.     Left lower leg: No edema.     Comments: Significant unilateral left-sided weakness relative to the right.  Patient unable to sustain the left arm in the air, or pick up the left leg off of the bed.  Asymmetric grip strength and strength in plantar/dorsiflexion with right > left.   Lymphadenopathy:     Cervical: No cervical adenopathy.  Skin:    General: Skin is warm and dry.  Neurological:     Mental Status: She is alert and oriented to person, place, and time. Mental status is at baseline.     Cranial Nerves: Cranial nerves are intact.     Sensory: Sensory deficit present.     Motor: Weakness, abnormal muscle tone and  pronator drift present.     Coordination: Coordination abnormal. Finger-Nose-Finger Test abnormal and Heel to [place] Test abnormal.     Comments: Patient unable to perform finger-to-nose testing left upper extremity, unable to perform heel-to-shin without difficulty.  There is altered sensation in the left upper extremity particularly in the hands, decreased sensation to light touch.  Cranial nerves are intact, no facial droop.  Very subtle intermittent slurring to the words, patient's husband at the bedside states this is not normal for her.  Psychiatric:        Mood and Affect: Mood normal.     ED Results / Procedures / Treatments   Labs (all labs ordered are listed, but only abnormal results are displayed) Labs Reviewed  I-STAT CHEM 8, ED - Abnormal; Notable for the following components:      Result Value   Creatinine, Ser 1.10 (*)    Calcium, Ion 1.10 (*)    Hemoglobin 15.3 (*)    All other components within normal limits  CBG MONITORING, ED - Abnormal; Notable for the following components:   Glucose-Capillary 113 (*)    All other components within normal limits  RESP PANEL BY RT-PCR (FLU A&B, COVID) ARPGX2  ETHANOL  PROTIME-INR  APTT  CBC  DIFFERENTIAL  COMPREHENSIVE METABOLIC PANEL  RAPID URINE DRUG SCREEN, HOSP PERFORMED  URINALYSIS, ROUTINE W REFLEX MICROSCOPIC    EKG EKG: normal sinus rhythm, no STEMI.   Radiology MR BRAIN WO CONTRAST  Result Date: 09/17/2020 CLINICAL DATA:  Acute stroke presentation. Left-sided weakness. Negative acute CT evaluation. EXAM: MRI HEAD WITHOUT CONTRAST TECHNIQUE: Multiplanar, multiecho pulse sequences of the brain and surrounding structures were obtained without intravenous contrast. COMPARISON:  CT studies same day.  MRI 04/18/2019. FINDINGS:  Brain: Diffusion imaging does not show any acute or subacute infarction. Chronic small-vessel ischemic changes affect the pons. No focal cerebellar insult. Cerebral hemispheres show moderate chronic  small-vessel ischemic changes of the deep and subcortical Papp matter, similar to the study of December 2020. No large vessel territory infarction. No mass, hemorrhage, hydrocephalus or extra-axial collection. Vascular: Major vessels at the base of the brain show flow. Skull and upper cervical spine: Negative Sinuses/Orbits: Clear/normal Other: None IMPRESSION: No acute finding. Moderate chronic small-vessel ischemic changes of the pons and cerebral hemispheric [place]ackett matter, similar to the study of December 2020. Electronically Signed   By: Nelson Chimes M.D.   On: 09/17/2020 15:12   CT C-SPINE NO CHARGE  Result Date: 09/17/2020 CLINICAL DATA:  Trauma.  Neck trauma. EXAM: CT CERVICAL SPINE WITHOUT CONTRAST TECHNIQUE: Multidetector CT imaging of the cervical spine was performed without intravenous contrast. Multiplanar CT image reconstructions were also generated. COMPARISON:  Same-day CT angiogram head/neck 09/17/2020. FINDINGS: Alignment: Reversal of the expected cervical lordosis. Cervical levocurvature. 2 mm C2-C3 and C3-C4 grade 1 anterolisthesis. Skull base and vertebrae: The basion-dental and atlanto-dental intervals are maintained.No evidence of acute fracture to the cervical spine. Soft tissues and spinal canal: No prevertebral fluid or swelling. No visible canal hematoma. Disc levels: Cervical spondylosis with multilevel disc space narrowing, disc bulges, endplate spurring, uncovertebral hypertrophy and facet arthrosis. Disc space narrowing is greatest at C4-C5, C5-C6 and C6-C7 (moderate/advanced at these levels). No appreciable high-grade spinal canal stenosis. Multilevel bony neural foraminal narrowing. Upper chest: No consolidation within the imaged lung apices. No visible pneumothorax. Other: Multiple thyroid nodules, the largest within the left lobe measuring 2.2 cm. IMPRESSION: No evidence of acute fracture to the cervical spine. Nonspecific reversal of the expected cervical lordosis. Cervical  levocurvature. C2-C3 and C3-C4 grade 1 anterolisthesis. Cervical spondylosis, as described. Multiple thyroid nodules, the largest within the left lobe measuring 2.2 cm. Non-emergent thyroid ultrasound recommended for further evaluation. Electronically Signed   By: Kellie Simmering DO   On: 09/17/2020 14:44   CT HEAD CODE STROKE WO CONTRAST  Result Date: 09/17/2020 CLINICAL DATA:  Code stroke. Neuro deficit, acute stroke suspected. Left sided weakness. EXAM: CT HEAD WITHOUT CONTRAST TECHNIQUE: Contiguous axial images were obtained from the base of the skull through the vertex without intravenous contrast. COMPARISON:  MRI 04/18/2019. FINDINGS: Brain: No evidence of acute large vascular territory infarction, acute hemorrhage, hydrocephalus, extra-axial collection or mass lesion/mass effect. There is moderate patchy Nunes matter hypoattenuation. Evaluation of the posterior fossa is limited by extensive streak artifact from dental amalgam. Vascular: No hyperdense vessel identified. Calcific atherosclerosis. Skull: No acute fracture. Sinuses/Orbits: Visualized sinuses are largely clear. Unremarkable orbits. Other: Periapical lucency of a left mandibular molar. ASPECTS [facility] Stroke Program Early CT Score) - Ganglionic level infarction (caudate, lentiform nuclei, internal capsule, insula, M1-M3 cortex): - Supraganglionic infarction (M4-M6 cortex): Total score (0-10 with 10 being normal): IMPRESSION: 1. No evidence of acute large vascular territory infarct or acute hemorrhage. ASPECTS is 10 2. Moderate/age advanced patchy [place] matter hypoattenuation, most likely chronic microvascular ischemic disease. Code stroke imaging results were communicated on 09/17/2020 at 1:55 pm to provider Dr. Leonel Ramsay via secure text paging. Electronically Signed   By: Margaretha Sheffield MD   On: 09/17/2020 13:56   CT ANGIO HEAD CODE STROKE  Result Date: 09/17/2020 CLINICAL DATA:  Neuro deficit, acute stroke suspected. EXAM: CT  ANGIOGRAPHY HEAD AND NECK TECHNIQUE: Multidetector CT imaging of the head and neck was performed using the standard  protocol during bolus administration of intravenous contrast. Multiplanar CT image reconstructions and MIPs were obtained to evaluate the vascular anatomy. Carotid stenosis measurements (when applicable) are obtained utilizing NASCET criteria, using the distal internal carotid diameter as the denominator. CONTRAST:  27mL OMNIPAQUE IOHEXOL 350 MG/ML SOLN COMPARISON:  None. FINDINGS: CTA NECK FINDINGS Aortic arch: Atherosclerosis. Great vessel origins are patent. 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 Right carotid system: Mild mixed atherosclerosis at the carotid bifurcation without significant (greater than 50%) stenosis. Left carotid system: Mixed atherosclerosis at the carotid bifurcation without significant (greater than 50%) stenosis. Vertebral arteries: Codominant. No evidence of dissection, stenosis (50% or greater) or occlusion. Skeleton: Please see concurrent CT of the cervical spine for evaluation of the cervical spine. Other neck: Heterogeneous 1.8 cm left thyroid nodule. Upper chest: Motion limited evaluation without consolidation or definite acute abnormality. Review of the MIP images confirms the above findings CTA HEAD FINDINGS Anterior circulation: No large vessel occlusion or proximal hemodynamically significant stenosis. No aneurysm. Posterior circulation: Small vertebrobasilar system with bilateral fetal like PCAs, anatomic variant. No large vessel occlusion or proximal hemodynamically significant stenosis. No aneurysm. Venous sinuses: Limited evaluation due to arterial timing without obvious dural sinus thrombosis. Small right transverse sinus. Anatomic variants: Described above. Review of the MIP images confirms the above findings IMPRESSION: CTA Head: No large vessel occlusion or proximal hemodynamically significant stenosis. CTA Neck: 1. No significant  stenosis in the neck. 2. 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 3. Heterogeneous 1.8 cm left thyroid nodule. Recommend non urgent thyroid ultrasound to further evaluate. 4. Please see concurrent CT of the cervical spine for evaluation of the spine. Electronically Signed   By: Margaretha Sheffield MD   On: 09/17/2020 14:22   CT ANGIO NECK CODE STROKE  Result Date: 09/17/2020 CLINICAL DATA:  Neuro deficit, acute stroke suspected. EXAM: CT ANGIOGRAPHY HEAD AND NECK TECHNIQUE: Multidetector CT imaging of the head and neck was performed using the standard protocol during bolus administration of intravenous contrast. Multiplanar CT image reconstructions and MIPs were obtained to evaluate the vascular anatomy. Carotid stenosis measurements (when applicable) are obtained utilizing NASCET criteria, using the distal internal carotid diameter as the denominator. CONTRAST:  82mL OMNIPAQUE IOHEXOL 350 MG/ML SOLN COMPARISON:  None. FINDINGS: CTA NECK FINDINGS Aortic arch: Atherosclerosis. Great vessel origins are patent. 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 Right carotid system: Mild mixed atherosclerosis at the carotid bifurcation without significant (greater than 50%) stenosis. Left carotid system: Mixed atherosclerosis at the carotid bifurcation without significant (greater than 50%) stenosis. Vertebral arteries: Codominant. No evidence of dissection, stenosis (50% or greater) or occlusion. Skeleton: Please see concurrent CT of the cervical spine for evaluation of the cervical spine. Other neck: Heterogeneous 1.8 cm left thyroid nodule. Upper chest: Motion limited evaluation without consolidation or definite acute abnormality. Review of the MIP images confirms the above findings CTA HEAD FINDINGS Anterior circulation: No large vessel occlusion or proximal hemodynamically significant stenosis. No aneurysm. Posterior circulation: Small vertebrobasilar system with bilateral  fetal like PCAs, anatomic variant. No large vessel occlusion or proximal hemodynamically significant stenosis. No aneurysm. Venous sinuses: Limited evaluation due to arterial timing without obvious dural sinus thrombosis. Small right transverse sinus. Anatomic variants: Described above. Review of the MIP images confirms the above findings IMPRESSION: CTA Head: No large vessel occlusion or proximal hemodynamically significant stenosis. CTA Neck: 1. No significant stenosis in the neck. 2. 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  3. Heterogeneous 1.8 cm left thyroid nodule. Recommend non urgent thyroid ultrasound to further evaluate. 4. Please see concurrent CT of the cervical spine for evaluation of the spine. Electronically Signed   By: Margaretha Sheffield MD   On: 09/17/2020 14:22    Procedures .Critical Care Performed by: Emeline Darling, PA-C Authorized by: Emeline Darling, PA-C   Critical care provider statement:    Critical care time (minutes):  45   Critical care was time spent personally by me on the following activities:  Discussions with consultants, evaluation of patient's response to treatment, examination of patient, ordering and performing treatments and interventions, ordering and review of laboratory studies, ordering and review of radiographic studies, pulse oximetry, re-evaluation of patient's condition, obtaining history from patient or surrogate and review of old charts    Medications Ordered in ED Medications  iohexol (OMNIPAQUE) 350 MG/ML injection 50 mL (50 mLs Intravenous Contrast Given 09/17/20 1406)    ED Course  I have reviewed the triage vital signs and the nursing notes.  Pertinent labs & imaging results that were available during my care of the patient were reviewed by me and considered in my medical decision making (see chart for details).  Clinical Course as of 09/17/20 1604  Tue Sep 17, 2020  1333 Last known well 11 am  [RS]     Clinical Course User Index [RS] Riese Hellard, Gypsy Balsam, PA-C   MDM Rules/Calculators/A&P                          65 year old female presents with concern for unilateral left-sided weakness following syncopal episode today.  Patient's presentation on intake significantly concerning for CVA.  Code stroke activated and patient accompanied by this provider to the CT scanner.  Patient was evaluated by neurologist, Dr. Kathrynn Speed, in the CT scanner with improvement in strength of the left upper extremity at the time of his exam.  Per neurology, CTA head and neck also obtained during CT scan and patient was transferred from CT to MRI for MRI of the brain.  Per neurology, if imaging is negative for infarct or bleed, patient may be transferred back to the emergency department for completion of syncope work-up.  I appreciate his collaboration in the care of this patient.  CT code stroke without evidence of large vascular territory infarct or acute hemorrhage, CTA head and neck code stroke without large vessel occlusion proximal hemodynamically significant stenosis.  Additionally there is no stenosis in the neck but there is 40% narrowing in the left subclavian artery origin.  Patient was informed of incidental finding of thyroid nodule to be followed up outpatient.  MRI of the brain without acute finding.  Due to complications securing IV access with this patient, laboratory studies have not yet been performed at the time of shift change.  Care of this patient signed out to oncoming ED provider, Martinique Robinson, PA-C, at time of shift change.  All pertinent HPI, physical exam, and imaging findings were discussed with her prior to my departure.  I appreciate her collaboration in the care of this patient.    Angelica Carter voiced understanding of her medical evaluation thus far. Each of her questions was answered to her expressed satisfaction.  She is amenable to plan for completion of work-up in the ED to  determine appropriate disposition.  This chart was dictated using voice recognition software, Dragon. Despite the best efforts of this provider to proofread and correct errors, errors  may still occur which can change documentation meaning.  Final Clinical Impression(s) / ED Diagnoses Final diagnoses:  Trauma    Rx / DC Orders ED Discharge Orders    None       Aura Dials 09/17/20 1604    Breck Coons, MD 09/18/20 1005

## 2020-09-17 NOTE — ED Notes (Signed)
Pt with L leg weakness. Provider at bedside.

## 2020-09-17 NOTE — Discharge Instructions (Signed)
Please stay hydrated and eat a well-balanced diet.   Follow close with your primary care provider regarding your visit today. Return to the emergency department if you develop recurrent or worsening symptoms.

## 2020-09-17 NOTE — ED Provider Notes (Addendum)
Care assumed at shift change from [place], PA-C, pending blood work and disposition. See their note for full HPI and workup. Briefly, pt presenting with after syncopal episode which then shortly developed into left-sided arm and leg weakness and numbness.  She was activated as code stroke on arrival, however stroke work-up is negative.  Her symptoms have resolved.  Neurology suggests this may have been more psychogenic as there were some inconsistencies in the neuro exam..  There is reported to be low suspicion for TIA as symptoms were present at the time of imaging.  She is cleared by neurology, pending syncope work-up.  However she is reporting she feels at her baseline is ready to go.  She is amenable to blood work as there was significant delay in obtaining blood work prior to assuming care of this patient. Lan to follow blood work, if negative patient remains at baseline she is appropriate for discharge to home with PCP follow-up. Physical Exam  BP 129/77   Pulse 70   Temp 98.3 F (36.8 C) (Oral)   Resp (!) 28   Wt 76.3 kg   SpO2 96%   BMI 33.97 kg/m   Physical Exam Vitals and nursing note reviewed.  Constitutional:      Appearance: She is well-developed.  HENT:     Head: Normocephalic and atraumatic.  Eyes:     Conjunctiva/sclera: Conjunctivae normal.  Cardiovascular:     Rate and Rhythm: Normal rate.  Pulmonary:     Effort: Pulmonary effort is normal.  Neurological:     Mental Status: She is alert.     Comments: Patient is ambulating with steady gait.  She is spontaneously moving all extremities without difficulty.  Psychiatric:        Mood and Affect: Mood normal.        Behavior: Behavior normal.     ED Course/Procedures   Clinical Course as of 09/17/20 1841  Tue Sep 17, 2020  1333 Last known well 11 am  [RS]    Clinical Course User Index [RS] Emeline Darling, PA-C    Procedures Results for orders placed or performed during the hospital encounter of  09/17/20  Resp Panel by RT-PCR (Flu A&B, Covid) Nasopharyngeal Swab   Specimen: Nasopharyngeal Swab; Nasopharyngeal(NP) swabs in vial transport medium  Result Value Ref Range   SARS Coronavirus 2 by RT PCR NEGATIVE NEGATIVE   Influenza A by PCR NEGATIVE NEGATIVE   Influenza B by PCR NEGATIVE NEGATIVE  Ethanol  Result Value Ref Range   Alcohol, Ethyl (B) <10 <10 mg/dL  Protime-INR  Result Value Ref Range   Prothrombin Time 14.4 11.4 - 15.2 seconds   INR 1.1 0.8 - 1.2  APTT  Result Value Ref Range   aPTT 26 24 - 36 seconds  CBC  Result Value Ref Range   WBC 11.2 (H) 4.0 - 10.5 K/uL   RBC 5.21 (H) 3.87 - 5.11 MIL/uL   Hemoglobin 14.8 12.0 - 15.0 g/dL   HCT 47.1 (H) 36.0 - 46.0 %   MCV 90.4 80.0 - 100.0 fL   MCH 28.4 26.0 - 34.0 pg   MCHC 31.4 30.0 - 36.0 g/dL   RDW 14.5 11.5 - 15.5 %   Platelets 219 150 - 400 K/uL   nRBC 0.0 0.0 - 0.2 %  Differential  Result Value Ref Range   Neutrophils Relative % 79 %   Neutro Abs 8.9 (H) 1.7 - 7.7 K/uL   Lymphocytes Relative 14 %   Lymphs  Abs 1.6 0.7 - 4.0 K/uL   Monocytes Relative 5 %   Monocytes Absolute 0.5 0.1 - 1.0 K/uL   Eosinophils Relative 0 %   Eosinophils Absolute 0.1 0.0 - 0.5 K/uL   Basophils Relative 1 %   Basophils Absolute 0.1 0.0 - 0.1 K/uL   Immature Granulocytes 1 %   Abs Immature Granulocytes 0.11 (H) 0.00 - 0.07 K/uL  Comprehensive metabolic panel  Result Value Ref Range   Sodium 136 135 - 145 mmol/L   Potassium 4.4 3.5 - 5.1 mmol/L   Chloride 105 98 - 111 mmol/L   CO2 22 22 - 32 mmol/L   Glucose, Bld 87 70 - 99 mg/dL   BUN 15 8 - 23 mg/dL   Creatinine, Ser 1.20 (H) 0.44 - 1.00 mg/dL   Calcium 9.1 8.9 - 10.3 mg/dL   Total Protein 6.6 6.5 - 8.1 g/dL   Albumin 3.7 3.5 - 5.0 g/dL   AST 34 15 - 41 U/L   ALT 54 (H) 0 - 44 U/L   Alkaline Phosphatase 72 38 - 126 U/L   Total Bilirubin 0.8 0.3 - 1.2 mg/dL   GFR, Estimated 50 (L) >60 mL/min   Anion gap 9 5 - 15  Urine rapid drug screen (hosp performed)  Result  Value Ref Range   Opiates NONE DETECTED NONE DETECTED   Cocaine NONE DETECTED NONE DETECTED   Benzodiazepines NONE DETECTED NONE DETECTED   Amphetamines NONE DETECTED NONE DETECTED   Tetrahydrocannabinol POSITIVE (A) NONE DETECTED   Barbiturates NONE DETECTED NONE DETECTED  Urinalysis, Routine w reflex microscopic Urine, Clean Catch  Result Value Ref Range   Color, Urine YELLOW YELLOW   APPearance HAZY (A) CLEAR   Specific Gravity, Urine 1.032 (H) 1.005 - 1.030   pH 6.0 5.0 - 8.0   Glucose, UA NEGATIVE NEGATIVE mg/dL   Hgb urine dipstick NEGATIVE NEGATIVE   Bilirubin Urine NEGATIVE NEGATIVE   Ketones, ur NEGATIVE NEGATIVE mg/dL   Protein, ur NEGATIVE NEGATIVE mg/dL   Nitrite NEGATIVE NEGATIVE   Leukocytes,Ua NEGATIVE NEGATIVE  I-stat chem 8, ED  Result Value Ref Range   Sodium 138 135 - 145 mmol/L   Potassium 4.2 3.5 - 5.1 mmol/L   Chloride 105 98 - 111 mmol/L   BUN 18 8 - 23 mg/dL   Creatinine, Ser 1.10 (H) 0.44 - 1.00 mg/dL   Glucose, Bld 84 70 - 99 mg/dL   Calcium, Ion 1.10 (L) 1.15 - 1.40 mmol/L   TCO2 25 22 - 32 mmol/L   Hemoglobin 15.3 (H) 12.0 - 15.0 g/dL   HCT 45.0 36.0 - 46.0 %  CBG monitoring, ED  Result Value Ref Range   Glucose-Capillary 113 (H) 70 - 99 mg/dL   MR BRAIN WO CONTRAST  Result Date: 09/17/2020 CLINICAL DATA:  Acute stroke presentation. Left-sided weakness. Negative acute CT evaluation. EXAM: MRI HEAD WITHOUT CONTRAST TECHNIQUE: Multiplanar, multiecho pulse sequences of the brain and surrounding structures were obtained without intravenous contrast. COMPARISON:  CT studies same day.  MRI 04/18/2019. FINDINGS: Brain: Diffusion imaging does not show any acute or subacute infarction. Chronic small-vessel ischemic changes affect the pons. No focal cerebellar insult. Cerebral hemispheres show moderate chronic small-vessel ischemic changes of the deep and subcortical Abend matter, similar to the study of December 2020. No large vessel territory infarction. No  mass, hemorrhage, hydrocephalus or extra-axial collection. Vascular: Major vessels at the base of the brain show flow. Skull and upper cervical spine: Negative Sinuses/Orbits: Clear/normal Other: None IMPRESSION:  No acute finding. Moderate chronic small-vessel ischemic changes of the pons and cerebral hemispheric Laughman matter, similar to the study of December 2020. Electronically Signed   By: Nelson Chimes M.D.   On: 09/17/2020 15:12   CT C-SPINE NO CHARGE  Result Date: 09/17/2020 CLINICAL DATA:  Trauma.  Neck trauma. EXAM: CT CERVICAL SPINE WITHOUT CONTRAST TECHNIQUE: Multidetector CT imaging of the cervical spine was performed without intravenous contrast. Multiplanar CT image reconstructions were also generated. COMPARISON:  Same-day CT angiogram head/neck 09/17/2020. FINDINGS: Alignment: Reversal of the expected cervical lordosis. Cervical levocurvature. 2 mm C2-C3 and C3-C4 grade 1 anterolisthesis. Skull base and vertebrae: The basion-dental and atlanto-dental intervals are maintained.No evidence of acute fracture to the cervical spine. Soft tissues and spinal canal: No prevertebral fluid or swelling. No visible canal hematoma. Disc levels: Cervical spondylosis with multilevel disc space narrowing, disc bulges, endplate spurring, uncovertebral hypertrophy and facet arthrosis. Disc space narrowing is greatest at C4-C5, C5-C6 and C6-C7 (moderate/advanced at these levels). No appreciable high-grade spinal canal stenosis. Multilevel bony neural foraminal narrowing. Upper chest: No consolidation within the imaged lung apices. No visible pneumothorax. Other: Multiple thyroid nodules, the largest within the left lobe measuring 2.2 cm. IMPRESSION: No evidence of acute fracture to the cervical spine. Nonspecific reversal of the expected cervical lordosis. Cervical levocurvature. C2-C3 and C3-C4 grade 1 anterolisthesis. Cervical spondylosis, as described. Multiple thyroid nodules, the largest within the left lobe  measuring 2.2 cm. Non-emergent thyroid ultrasound recommended for further evaluation. Electronically Signed   By: Kellie Simmering DO   On: 09/17/2020 14:44   CT HEAD CODE STROKE WO CONTRAST  Result Date: 09/17/2020 CLINICAL DATA:  Code stroke. Neuro deficit, acute stroke suspected. Left sided weakness. EXAM: CT HEAD WITHOUT CONTRAST TECHNIQUE: Contiguous axial images were obtained from the base of the skull through the vertex without intravenous contrast. COMPARISON:  MRI 04/18/2019. FINDINGS: Brain: No evidence of acute large vascular territory infarction, acute hemorrhage, hydrocephalus, extra-axial collection or mass lesion/mass effect. There is moderate patchy Tiemann matter hypoattenuation. Evaluation of the posterior fossa is limited by extensive streak artifact from dental amalgam. Vascular: No hyperdense vessel identified. Calcific atherosclerosis. Skull: No acute fracture. Sinuses/Orbits: Visualized sinuses are largely clear. Unremarkable orbits. Other: Periapical lucency of a left mandibular molar. ASPECTS [facility] Stroke Program Early CT Score) - Ganglionic level infarction (caudate, lentiform nuclei, internal capsule, insula, M1-M3 cortex): - Supraganglionic infarction (M4-M6 cortex): Total score (0-10 with 10 being normal): IMPRESSION: 1. No evidence of acute large vascular territory infarct or acute hemorrhage. ASPECTS is 10 2. Moderate/age advanced patchy Majeed matter hypoattenuation, most likely chronic microvascular ischemic disease. Code stroke imaging results were communicated on 09/17/2020 at 1:55 pm to provider Dr. Leonel Ramsay via secure text paging. Electronically Signed   By: Margaretha Sheffield MD   On: 09/17/2020 13:56   CT ANGIO HEAD CODE STROKE  Result Date: 09/17/2020 CLINICAL DATA:  Neuro deficit, acute stroke suspected. EXAM: CT ANGIOGRAPHY HEAD AND NECK TECHNIQUE: Multidetector CT imaging of the head and neck was performed using the standard protocol during bolus administration of  intravenous contrast. Multiplanar CT image reconstructions and MIPs were obtained to evaluate the vascular anatomy. Carotid stenosis measurements (when applicable) are obtained utilizing NASCET criteria, using the distal internal carotid diameter as the denominator. CONTRAST:  71mL OMNIPAQUE IOHEXOL 350 MG/ML SOLN COMPARISON:  None. FINDINGS: CTA NECK FINDINGS Aortic arch: Atherosclerosis. Great vessel origins are patent. 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 Right carotid system: Mild mixed atherosclerosis at the  carotid bifurcation without significant (greater than 50%) stenosis. Left carotid system: Mixed atherosclerosis at the carotid bifurcation without significant (greater than 50%) stenosis. Vertebral arteries: Codominant. No evidence of dissection, stenosis (50% or greater) or occlusion. Skeleton: Please see concurrent CT of the cervical spine for evaluation of the cervical spine. Other neck: Heterogeneous 1.8 cm left thyroid nodule. Upper chest: Motion limited evaluation without consolidation or definite acute abnormality. Review of the MIP images confirms the above findings CTA HEAD FINDINGS Anterior circulation: No large vessel occlusion or proximal hemodynamically significant stenosis. No aneurysm. Posterior circulation: Small vertebrobasilar system with bilateral fetal like PCAs, anatomic variant. No large vessel occlusion or proximal hemodynamically significant stenosis. No aneurysm. Venous sinuses: Limited evaluation due to arterial timing without obvious dural sinus thrombosis. Small right transverse sinus. Anatomic variants: Described above. Review of the MIP images confirms the above findings IMPRESSION: CTA Head: No large vessel occlusion or proximal hemodynamically significant stenosis. CTA Neck: 1. No significant stenosis in the neck. 2. 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 3. Heterogeneous 1.8 cm left thyroid nodule. Recommend non urgent  thyroid ultrasound to further evaluate. 4. Please see concurrent CT of the cervical spine for evaluation of the spine. Electronically Signed   By: Margaretha Sheffield MD   On: 09/17/2020 14:22   CT ANGIO NECK CODE STROKE  Result Date: 09/17/2020 CLINICAL DATA:  Neuro deficit, acute stroke suspected. EXAM: CT ANGIOGRAPHY HEAD AND NECK TECHNIQUE: Multidetector CT imaging of the head and neck was performed using the standard protocol during bolus administration of intravenous contrast. Multiplanar CT image reconstructions and MIPs were obtained to evaluate the vascular anatomy. Carotid stenosis measurements (when applicable) are obtained utilizing NASCET criteria, using the distal internal carotid diameter as the denominator. CONTRAST:  36mL OMNIPAQUE IOHEXOL 350 MG/ML SOLN COMPARISON:  None. FINDINGS: CTA NECK FINDINGS Aortic arch: Atherosclerosis. Great vessel origins are patent. 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 Right carotid system: Mild mixed atherosclerosis at the carotid bifurcation without significant (greater than 50%) stenosis. Left carotid system: Mixed atherosclerosis at the carotid bifurcation without significant (greater than 50%) stenosis. Vertebral arteries: Codominant. No evidence of dissection, stenosis (50% or greater) or occlusion. Skeleton: Please see concurrent CT of the cervical spine for evaluation of the cervical spine. Other neck: Heterogeneous 1.8 cm left thyroid nodule. Upper chest: Motion limited evaluation without consolidation or definite acute abnormality. Review of the MIP images confirms the above findings CTA HEAD FINDINGS Anterior circulation: No large vessel occlusion or proximal hemodynamically significant stenosis. No aneurysm. Posterior circulation: Small vertebrobasilar system with bilateral fetal like PCAs, anatomic variant. No large vessel occlusion or proximal hemodynamically significant stenosis. No aneurysm. Venous sinuses: Limited  evaluation due to arterial timing without obvious dural sinus thrombosis. Small right transverse sinus. Anatomic variants: Described above. Review of the MIP images confirms the above findings IMPRESSION: CTA Head: No large vessel occlusion or proximal hemodynamically significant stenosis. CTA Neck: 1. No significant stenosis in the neck. 2. 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 3. Heterogeneous 1.8 cm left thyroid nodule. Recommend non urgent thyroid ultrasound to further evaluate. 4. Please see concurrent CT of the cervical spine for evaluation of the spine. Electronically Signed   By: Margaretha Sheffield MD   On: 09/17/2020 14:22    MDM  Electrolytes are without acute significant change from baseline.  No anemia.  UA is negative.  Patient remains at her baseline, ambulating with a steady gait and without recurrent lightheadedness throughout  the ED.  She is ready for discharge.  Recommend close PCP follow-up, return if symptoms worsen.      Mililani Murthy, [place] N, PA-C 09/17/20 1841    Joniel Graumann, [place] N, PA-C 09/17/20 1841    Breck Coons, MD 09/18/20 [phone]

## 2020-09-17 NOTE — ED Notes (Signed)
Got patient on the monitor did ekg shown to er provider patient is resting with call bell in reach and family at bedside

## 2020-09-17 NOTE — Code Documentation (Signed)
Pt is a 65 yr old female who presented to ED via EMS after syncopal episode. When she was evaluated in ED, she was slurring her words, and had left sided weakness, prompting EDP to activate Code Stroke. Pt was taken to CT scan. CTNC negative for acute hemorrhage per Dr Leonel Ramsay. CTA was performed, which was negative for LVO per neurologist. Pt was taken to urgent MRI to determine treatment eligibility for TPA. Per Neurologist, MRI is negative for ischemia, so IV TPA was not given. Pt to return to ED room 20 for remained of work-up. Bedside handoff with Verdis Frederickson, RN. Pt will need q 2 hr VS and mNIHSS as workup continues.Please see flowsheet for timeline and full NIHSS.

## 2020-09-17 NOTE — ED Triage Notes (Signed)
BIB EMS for syncopal event witnessed by husband. Pt was sitting and fell hitting the side of her head. When EMS arrived pt was diaphoretic, pale and was unable to get BP. IVF was administered, SBP went up to 90s.Hx of same secondary to dehydration. Vomit x1. Pt also c/o numbness and tingling in BUE and weakness in L: leg started in the ambulance. Pain to lower and mid back pain, Hx of spinal reconstruction. Pt on C-collar.

## 2020-09-17 NOTE — Consult Note (Addendum)
Neurology consult   CC: code stroke for left sided numbness and weakness, syncopal episode.  History is obtained from: Patient, chart, EMS  HPI: Angelica Carter is a 65 yo female with a PMHx of TIA, CKD, COPD, ADHD, GAD, depression, and OSA. Patient presented to [facility] ED via EMS as a code stroke. Per EMS, patient was at a friend's house at 1100 and passed out with altered mental status. ? Dysarthria per family member. When EMS arrived, patient complained of left arm numbness around 1130 hours. Patient also noted to have left sided weakness with inability to lift left arm or leg.   After brief exam at ED bridge and airway clearance, patient was taken emergently to CT suite. CTH showed no acute infarct or hemorrhage. CTA head and neck without LVO. Patient was taken to MRI which was negative for acute infarct. See exam below, but due to embellishment and inconsistency on exam, we felt this was likely functional.   After tests, patient was taken back to ED for disposition.   LKW:  1100 hours tpa given?: No, imaging negative for stroke and believe symptoms are functional.   IR Thrombectomy?: No, no LVO on CTA.   MRS 0.  NIHSS:  1a Level of Consciousness: 0 1b LOC Questions: 0 1c LOC Commands: 0 2 Best Gaze: 0 3 Visual: 0 4 Facial Palsy: 0 5a Motor Arm - left: 3 5b Motor Arm - Right: 2 6a Motor Leg - Left: 3 6b Motor Leg - Right: 2 7 Limb Ataxia: 2 8 Sensory: 0 9 Best Language: 0 10 Dysarthria: 0 11 Extinction and Inattention: 0  TOTAL:  12  ROS: A robust ROS was not able to be performed due to the emergent nature of event.    Past Medical History:  Diagnosis Date  . ADHD (attention deficit hyperactivity disorder)    diagnosed in childhood; unspecified type but most likely inattentive  . Allergy   . Aortic atherosclerosis   . Arthritis   . Asthma   . Chronic kidney disease (CKD)    born with only one kidney  . COPD (chronic obstructive pulmonary disease)   . Degenerative lumbar  spinal stenosis 08/10/2016  . Fibromyalgia   . Generalized anxiety disorder   . History of concussion   . Left foot drop 07/14/2017  . LLQ pain 02/08/2020  . Low back pain 07/14/2017  . Major depressive disorder   . Obstructive sleep apnea    Not currently using CPAP machine as machine is broken; awaiting parts  . TIA (transient ischemic attack) 2009  . Tuberculosis      Family History  Problem Relation Age of Onset  . Colon cancer Mother 34  . Heart disease Father   . Colon polyps Sister   . Stomach cancer Neg Hx   . Pancreatic cancer Neg Hx   . Esophageal cancer Neg Hx     Social History:  reports that she has been smoking. She has been smoking about 0.25 packs per day. She has never used smokeless tobacco. She reports previous alcohol use. She reports current drug use. Drug: Marijuana.   Prior to Admission medications   Medication Sig Start Date End Date Taking? Authorizing Provider  amLODipine (NORVASC) 10 MG tablet Take 10 mg by mouth daily.    [provider]  buPROPion (WELLBUTRIN XL) 150 MG 24 hr tablet Take 150 mg by mouth daily. 08/17/19   [provider]  dicyclomine (BENTYL) 20 MG tablet Take 1 tablet (20 mg  total) by mouth 3 (three) times daily as needed for spasms. 04/02/20   Armbruster, Carlota Raspberry, MD  DIFLUCAN 150 MG tablet Take by mouth daily. 02/07/20   [provider]  HYDROcodone-acetaminophen (NORCO/VICODIN) 5-325 MG tablet Take 1 tablet by mouth 2 (two) times daily. 01/24/20   [provider]  ondansetron (ZOFRAN) 4 MG tablet Take 4 mg by mouth 3 (three) times daily as needed. 10/27/19   [provider]  pantoprazole (PROTONIX) 40 MG tablet Take 40 mg by mouth daily. 10/27/19   [provider]  pregabalin (LYRICA) 200 MG capsule Take 200 mg by mouth 3 (three) times daily.    [provider]  SENNOSIDES-DOCUSATE SODIUM PO Take by mouth.    [provider]  SUMAtriptan (IMITREX) 25 MG tablet Take  25 mg by mouth every 2 (two) hours as needed for migraine. May repeat in 2 hours if headache persists or recurs.    [provider]  SYMBICORT 80-4.5 MCG/ACT inhaler Inhale 1 puff into the lungs 2 (two) times daily. 08/17/19   [provider]  traZODone (DESYREL) 50 MG tablet Take 75 mg by mouth at bedtime.    [provider]    Exam: Current vital signs: BP 109/83   Pulse 63   Temp 99.4 F (37.4 C) (Oral)   Resp 19   Wt 76.3 kg   SpO2 96%   BMI 33.97 kg/m   Physical Exam  Constitutional: Appears well-developed and well-nourished.  Psych: Affect appropriate to situation. Eyes: No scleral injection HENT: No OP obstrucion Head: Normocephalic.  Cardiovascular: Normal rate and regular rhythm.  Respiratory: Effort normal. GI: Soft.  No distension. There is no tenderness.  Skin: WDI.  Neuro: Mental Status: Patient is awake, alert, oriented to person, place, month, year, and situation. Patient is able to give a clear and coherent history. No signs of neglect. Speech/Language:  Speech is clear, fluent without dysarthria or aphasia. Repetition, naming, and comprehension intact.  Cranial Nerves: II: Visual Fields are full. Pupils are equal, round, and reactive to light.  III,IV, VI: EOMI without ptosis or diploplia.  V: Facial sensation is symmetric to light touch in V1, V2, and V3 VII: Facial movement is symmetric.  VIII: hearing is intact to voice. X: Uvula elevates symmetrically. XI: Shoulder shrug is symmetric. XII: tongue is midline without atrophy or fasciculations.  Motor: RUE:  grips  4/5     biceps  4/5     triceps  4/5 LUE:  grips   0/5    biceps 0/5     triceps  0/5 RLE: knee  3+/5     thigh   3+/5     plantar flexion   4/5     dorsiflexion  4/5 LLE: knee  0/5     thigh   0/5      plantar flexion   0/5     dorsiflexion   0/5 Tone is normal on right and flaccid on left. Bulk is normal. Sensory: Sensation is symmetric to light touch in all  fours extremities. States her left hand is numb but sensory exam normal. Extinction absent to light touch DSS.  Plantars: Toes are downgoing bilaterally.  Cerebellar: Ataxia noted to BUE. Will not perform LE testing. She embellishes her ataxic exam with arms.   I have reviewed labs in epic and the pertinent results are: INR 1.1        aPTT   26      UA negative  UDS + THC  MD reviewed the images obtained:  NCT head  -No evidence of acute large vascular territory infarct or acute hemorrhage. ASPECTS is 10 -Moderate/age advanced patchy Lajara matter hypoattenuation, most likely chronic microvascular ischemic disease.  MRI brain No acute finding. Moderate chronic small-vessel ischemic changes of the pons and cerebral hemispheric Aguillard matter, similar to the study of December 2020.  CTA head and neck  CTA Head: No large vessel occlusion or proximal hemodynamically significant stenosis. CTA Neck:  -No significant stenosis in the neck. -Approximately 40% narrowing of the left subclavian artery origin. -Heterogeneous 1.8 cm left thyroid nodule. Recommend non urgent thyroid ultrasound to further evaluate. - Please see concurrent CT of the cervical spine for evaluation of the spine.   Electronically signed by: Clance Boll, MSN, APN-BC, nurse practitioner and by MD. Note/plan to be edited by MD as needed.  Pager: [phone]  I have seen the patient reviewed the above note.  On my exam, she has a markedly inconsistent exam with a completely flaccid left upper extremity, but then when encouraged to do finger-nose-finger she is able to perform it and when I am performing contralateral distracting maneuvers, she is able to perform it fairly well.   Given that there could be concern for possible embellishment superimposed on a true deficit, I took the patient emergently to MRI which thankfully was negative.   Assessment: 65 year old female presenting with unilateral deficits  with inconsistent exam and negative MRI. It is possible there is some degree of complicated migraine given the complaint of headache, but nonorganic etiology is also a possibility.  I initially recommended the MRI which is available at the time of finalizing this note, with negative MRI coupled with her clinical exam, I do not think that further neurological work-up beyond this is needed at this time.  Please call with further questions or concerns.  Angelica Rack, MD [facility] [phone]  If 7pm- 7am, please page neurology on call as listed in [place].

## 2020-10-07 ENCOUNTER — Other Ambulatory Visit: Payer: Self-pay | Admitting: Family

## 2020-10-07 DIAGNOSIS — E041 Nontoxic single thyroid nodule: Secondary | ICD-10-CM

## 2020-10-09 ENCOUNTER — Ambulatory Visit
Admission: RE | Admit: 2020-10-09 | Discharge: 2020-10-09 | Disposition: A | Discharge status: Home / Self Care | Payer: Medicare Other | Source: Ambulatory Visit | Attending: Family | Admitting: Family

## 2020-10-09 DIAGNOSIS — E041 Nontoxic single thyroid nodule: Secondary | ICD-10-CM

## 2020-10-09 IMAGING — US US THYROID
1 series · 13 of 25 positions shown · non-contrast
Comparison: CT [DATE]

CLINICAL DATA: Abnormal labs.  Left nodule noted on CTA neck

EXAM:
THYROID ULTRASOUND
TECHNIQUE: Ultrasound examination of the thyroid gland and adjacent soft
tissues was performed.

[Series 1: us thyroid · 0.06mm/px · 13 of 51 slices shown]
[im 1/51]
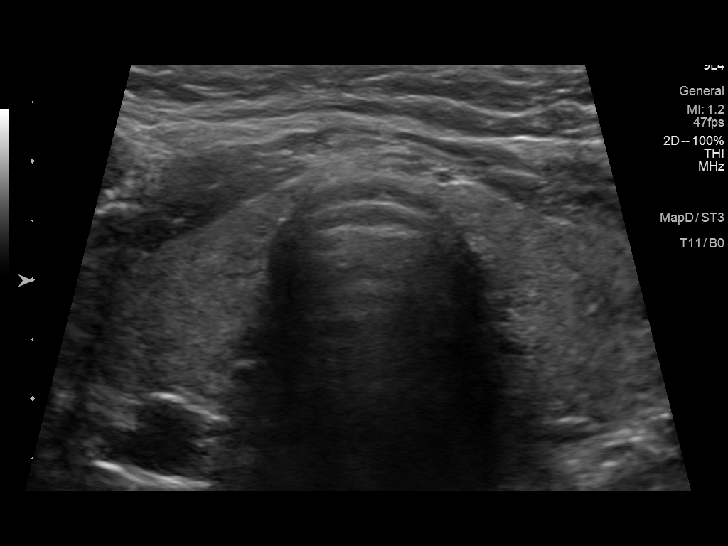
[im 5/51]
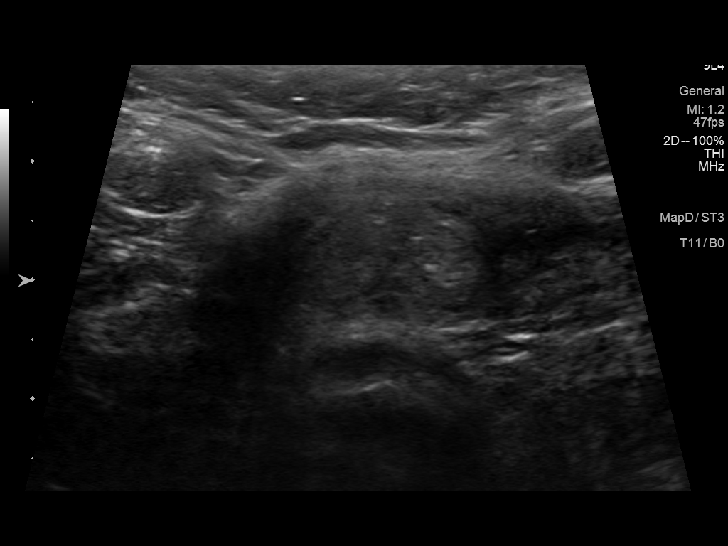
[im 9/51]
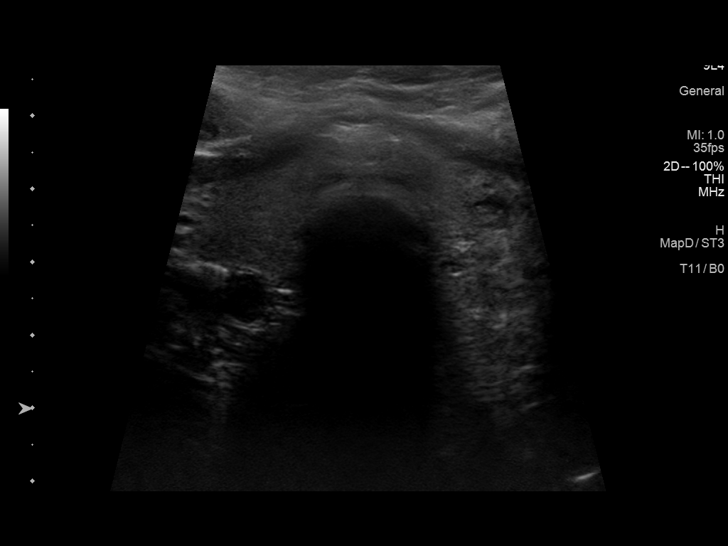
[im 13/51]
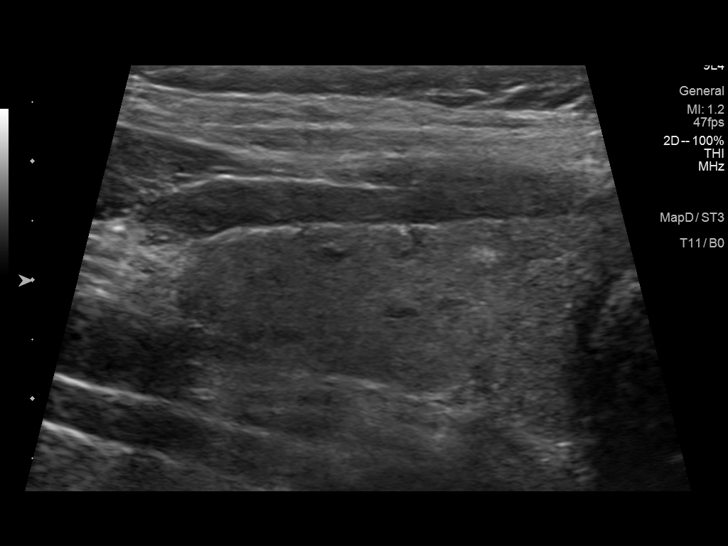
[im 17/51]
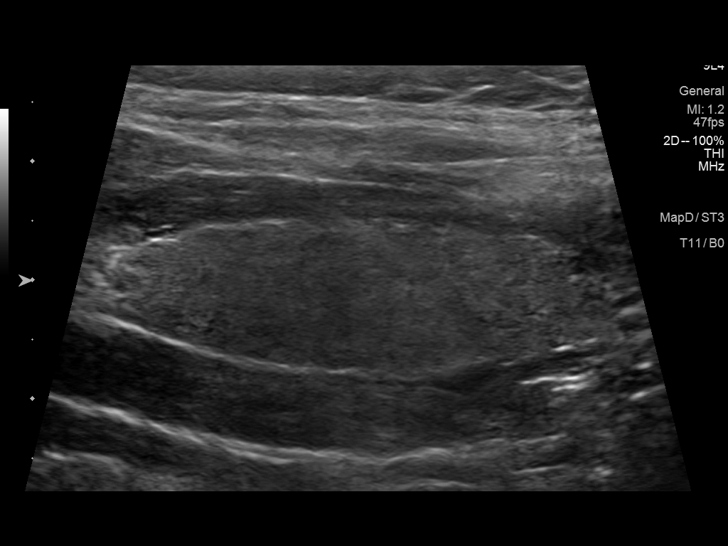
[im 21/51]
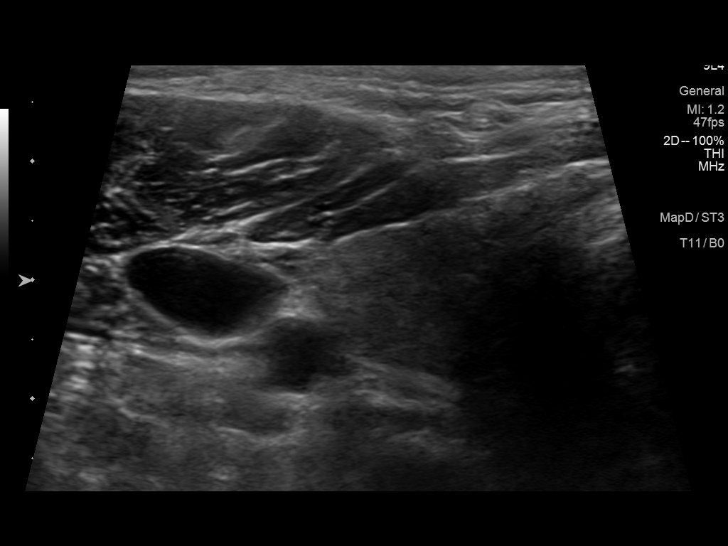
[im 26/51]
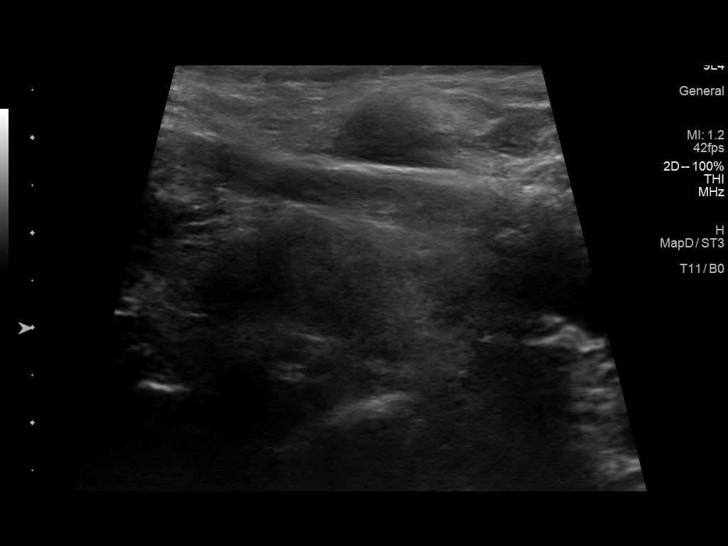
[im 30/51]
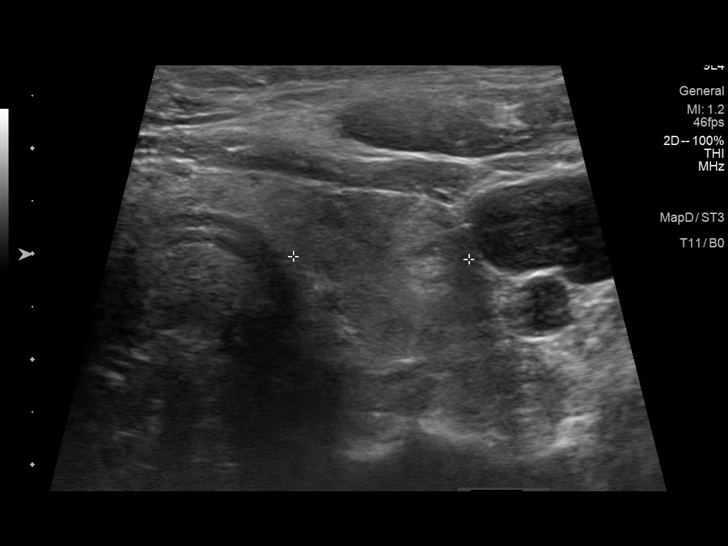
[im 34/51]
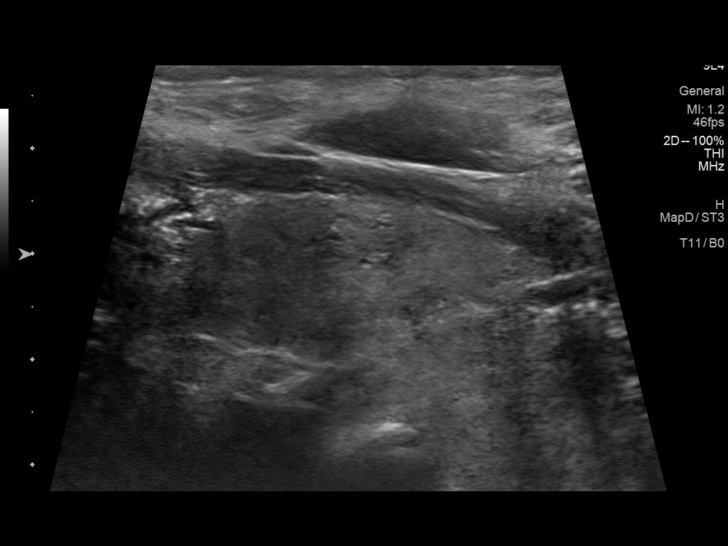
[im 38/51]
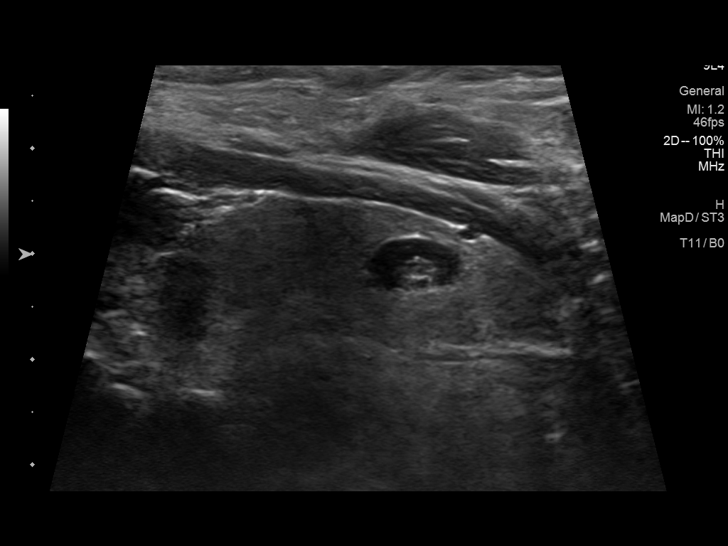
[im 42/51]
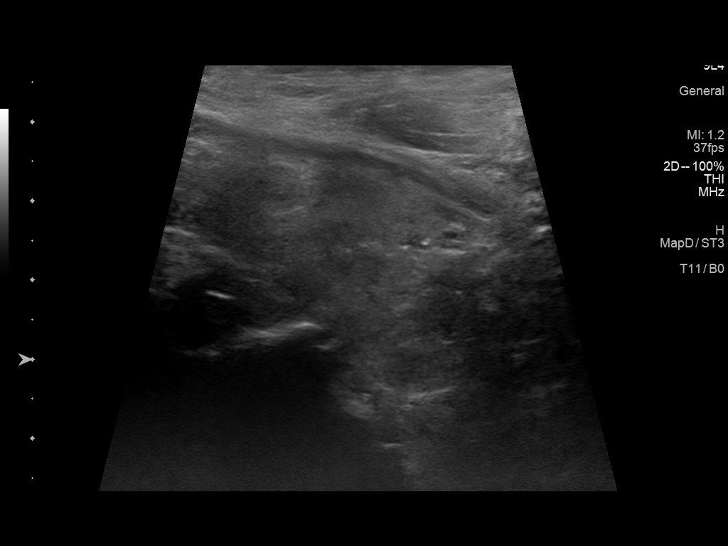
[im 46/51]
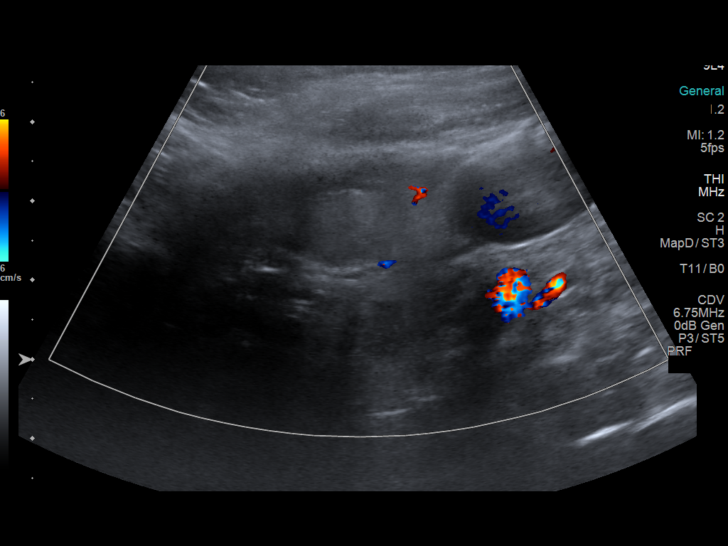
[im 51/51]
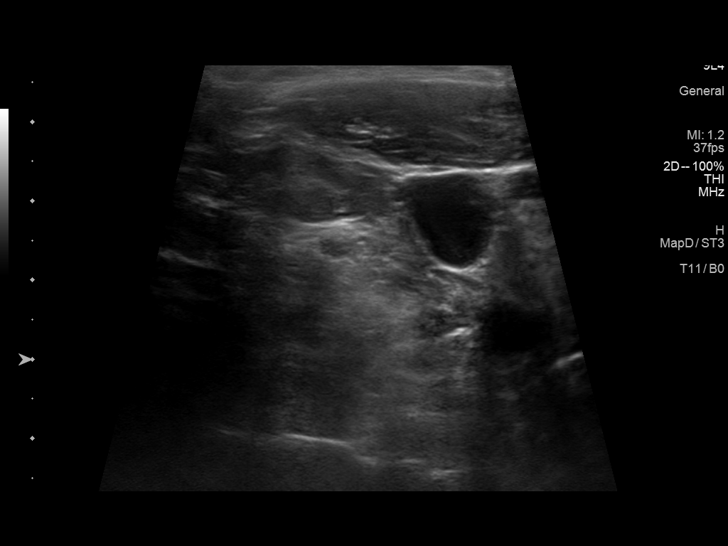

[13 of 25 positions shown; findings below may reference images not displayed]

FINDINGS: Parenchymal Echotexture: Moderately heterogenous

Isthmus: 1 cm thickness

Right lobe: 4.2 x 1.3 x 1.9 cm

Left lobe: 4.5 x 2.2 x 1.7 cm

_________________________________________________________

Estimated total number of nodules >/= 1 cm: 1

Number of spongiform nodules >/=  2 cm not described below (TR1): 0

Number of mixed cystic and solid nodules >/= 1.5 cm not described
below (TR2): 0

_________________________________________________________

Nodule # 1: 0.8 cm hyperechoic left isthmic without calcifications;
This nodule does NOT meet TI-RADS criteria for biopsy or dedicated
follow-up.

Nodule # 2: 0.8 cm isoechoic nodule mid left lobe without
calcifications; This nodule does NOT meet TI-RADS criteria for
biopsy or dedicated follow-up.

Nodule # 3: 0.9 cm complex cyst, mid left; This nodule does NOT meet
TI-RADS criteria for biopsy or dedicated follow-up.

Nodule # 4:

Location: Left;  inferior

Maximum size: 2.4 cm; Other 2 dimensions: 1.9 x 1.6 cm

Composition: solid/almost completely solid (2)

Echogenicity: isoechoic (1)

Shape: taller-than-wide (3)

Margins: ill-defined (0)

Echogenic foci: none (0)

ACR TI-RADS total points: 6.

ACR TI-RADS risk category: TR4 (4-6 points).

ACR TI-RADS recommendations:

**Given size (>/= 1.5 cm) and appearance, fine needle aspiration of
this moderately suspicious nodule should be considered based on
TI-RADS criteria.

_________________________________________________________

No regional cervical adenopathy identified.
IMPRESSION: 1. Normal-sized thyroid with isthmic and left nodules.
2. Recommend FNA biopsy of 2.4 cm moderately suspicious inferior
left nodule.

The above is in keeping with the ACR TI-RADS recommendations - [HOSPITAL] [Z5];[DATE].

## 2020-10-29 ENCOUNTER — Telehealth: Payer: Self-pay

## 2020-10-29 NOTE — Telephone Encounter (Signed)
-----   Message from [place] sent at 07/03/2020  2:20 PM EST ----- Patient needs 3rd and final Twinrix after 11/13/20.

## 2020-10-29 NOTE — Telephone Encounter (Signed)
Patient's 2nd shot was not until 07-03-20 so he will not be due until 8-9. Scheduled patient for a nurse visit on Thursday, 8-11 at 9:30 am.

## 2020-11-25 ENCOUNTER — Telehealth: Payer: Self-pay

## 2020-11-25 NOTE — Telephone Encounter (Signed)
-----   Message from Roetta Sessions, [place] sent at 10/29/2020 11:37 AM EDT ----- Patient's 2nd shot was not until 07-03-20 so he will not be due until 8-9. Scheduled patient for a nurse visit on Thursday, 8-11 at 9:30 am.   Call patient and advise of need for 3rd shot in august. Currently scheduled for 8-11 at 9:30am    ----- Message ----- From: Lindon Romp, CMA Sent: 10/28/2020  12:00 AM EDT To: Roetta Sessions, CMA  Patient needs 3rd and final Twinrix after 11/13/20.

## 2020-11-25 NOTE — Telephone Encounter (Signed)
Called patient but unable to leave a message as the voice mail box is full. Mailed a letter to the patient about her 3rd and final Twinrix vaccine Currently scheduled for 8-11 at 9:30am

## 2020-12-05 ENCOUNTER — Ambulatory Visit (INDEPENDENT_AMBULATORY_CARE_PROVIDER_SITE_OTHER): Payer: Medicare Other | Admitting: Gastroenterology

## 2020-12-05 DIAGNOSIS — Z23 Encounter for immunization: Secondary | ICD-10-CM

## 2020-12-05 NOTE — Patient Instructions (Signed)
Hepatitis A; Hepatitis B Vaccine injection What is this medication? HEPATITIS A VACCINE; HEPATITIS B VACCINE (hep uh TAHY tis A vak SEEN; hep uh TAHY tis B vak SEEN) is a vaccine to protect from an infection with the hepatitis A and B virus. This vaccine does not contain the live viruses. Itwill not cause a hepatitis infection. This medicine may be used for other purposes; ask your health care provider orpharmacist if you have questions. COMMON BRAND NAME(S): Twinrix What should I tell my care team before I take this medication? They need to know if you have any of these conditions: bleeding disorder fever or infection heart disease immune system problems an unusual or allergic reaction to hepatitis A or B vaccine, neomycin, yeast, thimerosal, other medicines, foods, dyes, or preservatives pregnant or trying to get pregnant breast-feeding How should I use this medication? This vaccine is for injection into a muscle. It is given by a health careprofessional. A copy of Vaccine Information Statements will be given before each vaccination.Read this sheet carefully each time. The sheet may change frequently. Talk to your pediatrician regarding the use of this medicine in children.Special care may be needed. Overdosage: If you think you have taken too much of this medicine contact apoison control center or emergency room at once. NOTE: This medicine is only for you. Do not share this medicine with others. What if I miss a dose? It is important not to miss your dose. Call your doctor or health careprofessional if you are unable to keep an appointment. What may interact with this medication? medicines that suppress your immune function like adalimumab, anakinra, infliximab medicines to treat cancer steroid medicines like prednisone or cortisone This list may not describe all possible interactions. Give your health care provider a list of all the medicines, herbs, non-prescription drugs, or dietary  supplements you use. Also tell them if you smoke, drink alcohol, or use illegaldrugs. Some items may interact with your medicine. What should I watch for while using this medication? See your health care provider for all shots of this vaccine as directed. You must have 3 to 4 shots of this vaccine for protection from hepatitis A and B infection. Tell your doctor right away if you have any serious or unusual sideeffects after getting this vaccine. What side effects may I notice from receiving this medication? Side effects that you should report to your doctor or health care professionalas soon as possible: allergic reactions like skin rash, itching or hives, swelling of the face, lips, or tongue breathing problems confused, irritated fast, irregular heartbeat flu-like syndrome numb, tingling pain seizures Side effects that usually do not require medical attention (report to yourdoctor or health care professional if they continue or are bothersome): diarrhea fever headache loss of appetite muscle pain nausea pain, redness, swelling, or irritation at site where injected tiredness This list may not describe all possible side effects. Call your doctor for medical advice about side effects. You may report side effects to FDA at1-800-FDA-1088. Where should I keep my medication? This drug is given in a hospital or clinic and will not be stored at home. NOTE: This sheet is a summary. It may not cover all possible information. If you have questions about this medicine, talk to your doctor, pharmacist, orhealth care provider.  2022 Elsevier/Gold Standard (2007-08-26 15:21:37)

## 2020-12-05 NOTE — Progress Notes (Signed)
Pt presents today for nurse visit for an injection. Per Dr. Havery Moros, injection of Twinrix given IM in R deltoid today by A. Keyontay Stolz, LPN. Denies any adverse reactions or discomfort.

## 2021-03-05 ENCOUNTER — Other Ambulatory Visit: Payer: Self-pay | Admitting: Internal Medicine

## 2021-03-05 DIAGNOSIS — E041 Nontoxic single thyroid nodule: Secondary | ICD-10-CM

## 2021-03-10 ENCOUNTER — Ambulatory Visit: Payer: Medicare Other | Admitting: Neurology

## 2021-04-08 ENCOUNTER — Ambulatory Visit
Admission: RE | Admit: 2021-04-08 | Discharge: 2021-04-08 | Disposition: A | Discharge status: Home / Self Care | Payer: Medicare Other | Source: Ambulatory Visit | Attending: Internal Medicine | Admitting: Internal Medicine

## 2021-04-08 ENCOUNTER — Other Ambulatory Visit (HOSPITAL_COMMUNITY)
Admission: RE | Admit: 2021-04-08 | Discharge: 2021-04-08 | Disposition: A | Discharge status: Home / Self Care | Payer: Medicare Other | Source: Ambulatory Visit | Attending: Internal Medicine | Admitting: Internal Medicine

## 2021-04-08 DIAGNOSIS — E041 Nontoxic single thyroid nodule: Secondary | ICD-10-CM | POA: Diagnosis present

## 2021-04-08 IMAGING — US US FNA BIOPSY THYROID 1ST LESION
1 series · 12 of 12 positions shown · non-contrast
Comparison: Prior thyroid ultrasound [DATE]

MEDICATIONS:
None

COMPLICATIONS:
None immediate.

INDICATION: Indeterminate thyroid nodule

EXAM:
ULTRASOUND GUIDED FINE NEEDLE ASPIRATION OF INDETERMINATE THYROID
NODULE
TECHNIQUE: Informed written consent was obtained from the patient after a
discussion of the risks, benefits and alternatives to treatment.
Questions regarding the procedure were encouraged and answered. A
timeout was performed prior to the initiation of the procedure.

[Series 1: us fna biopsy thyroid 1st lesion · 0.07mm/px · 12 acquisitions, 12 frames shown]
[im 1/12]
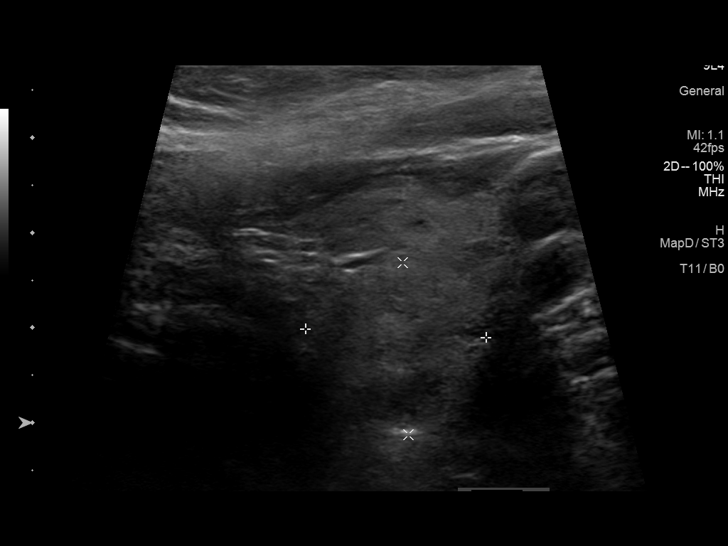
[im 2/12]
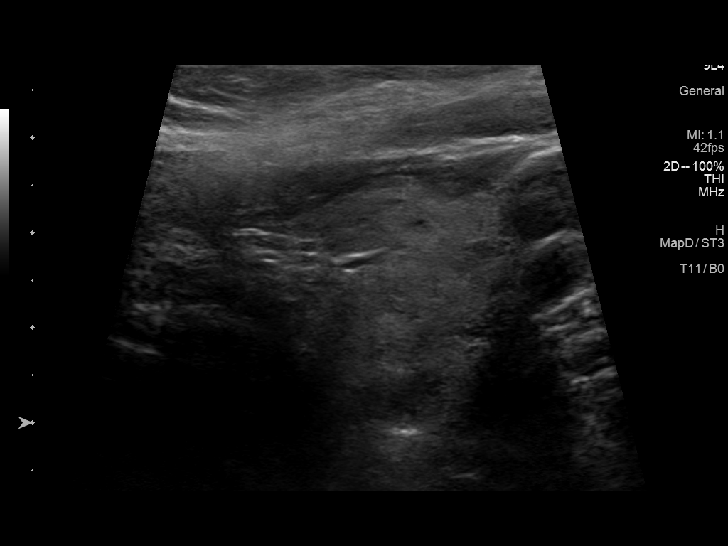
[im 3/12]
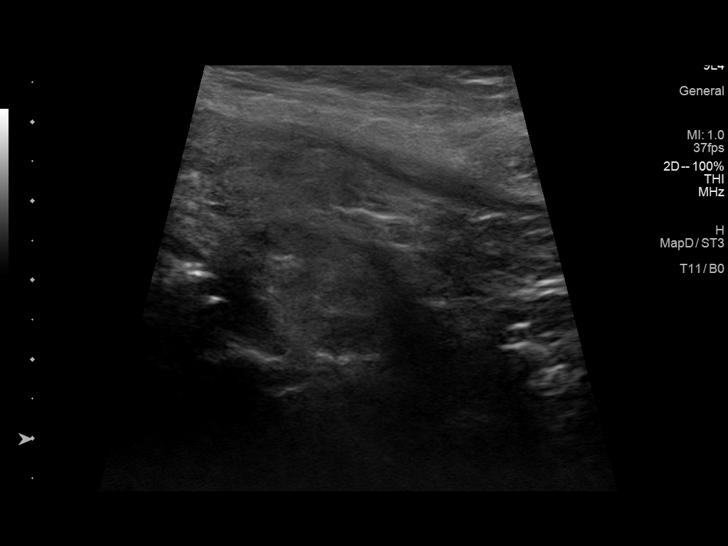
[im 4/12]
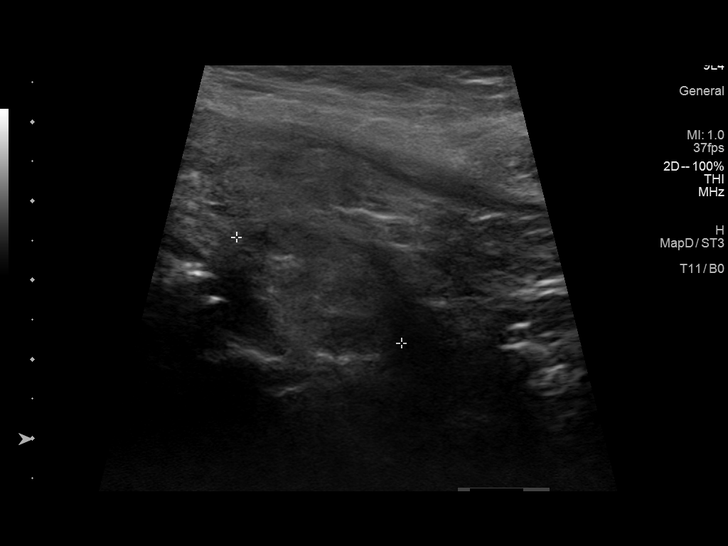
[im 5/12]
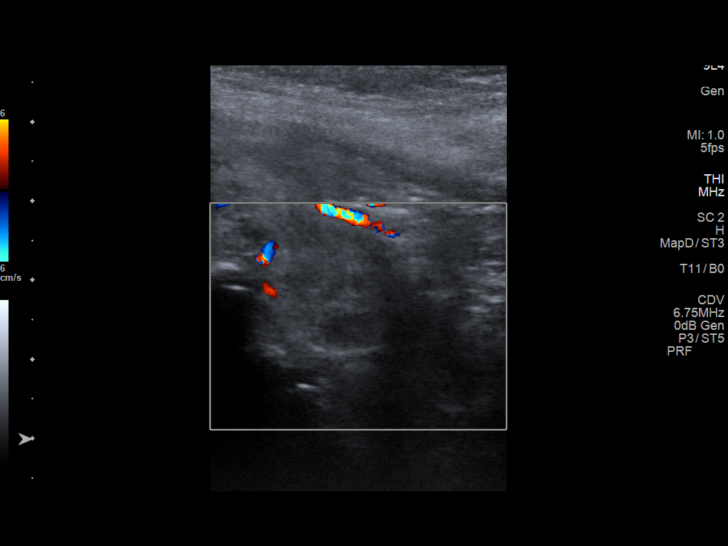
[im 6/12]
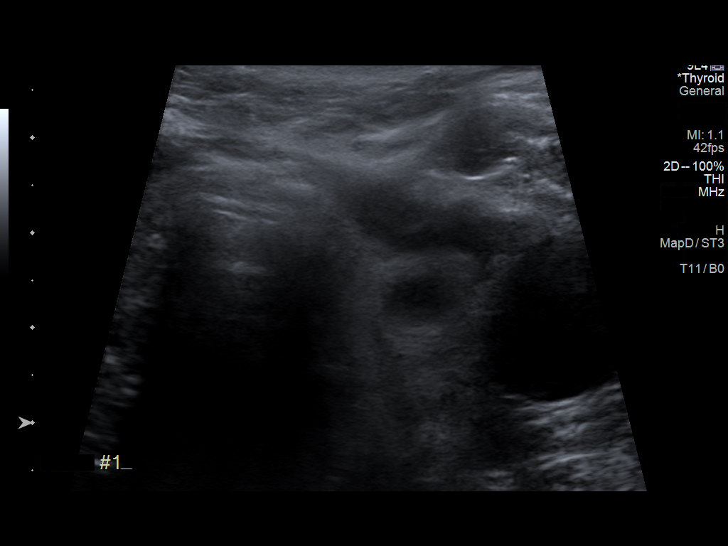
[im 7/12]
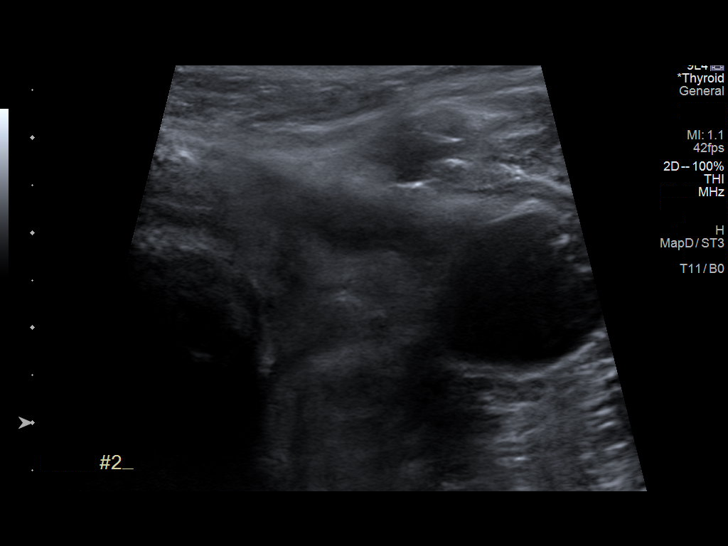
[im 8/12]
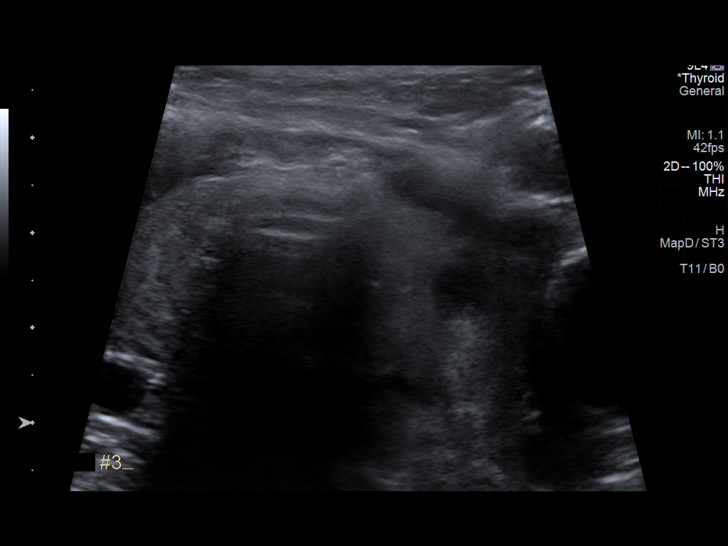
[im 9/12]
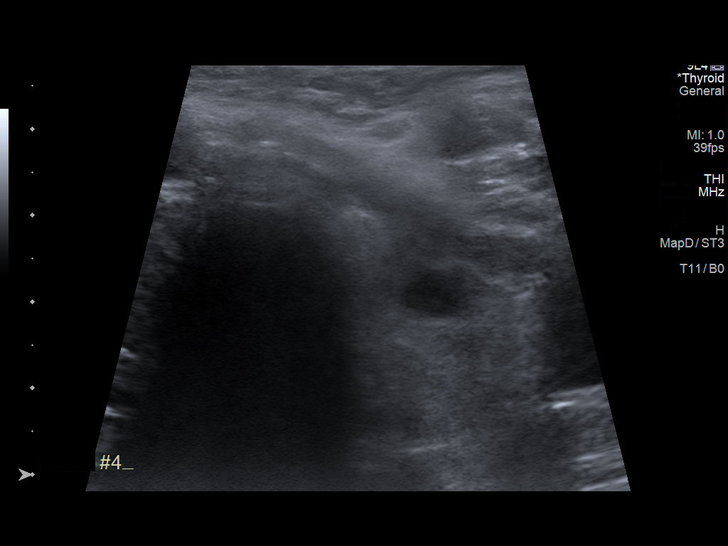
[im 10/12]
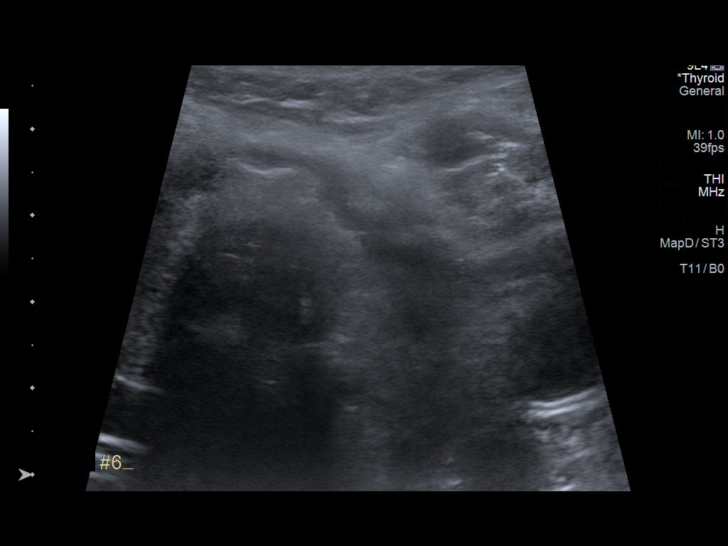
[im 11/12]
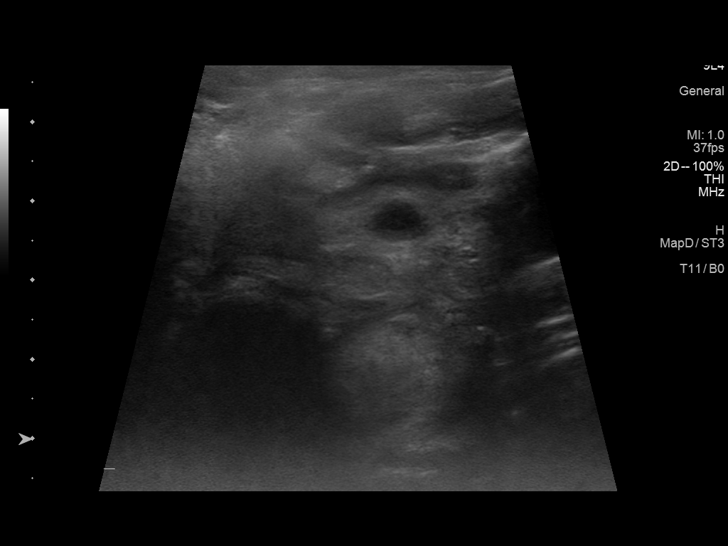
[im 12/12]
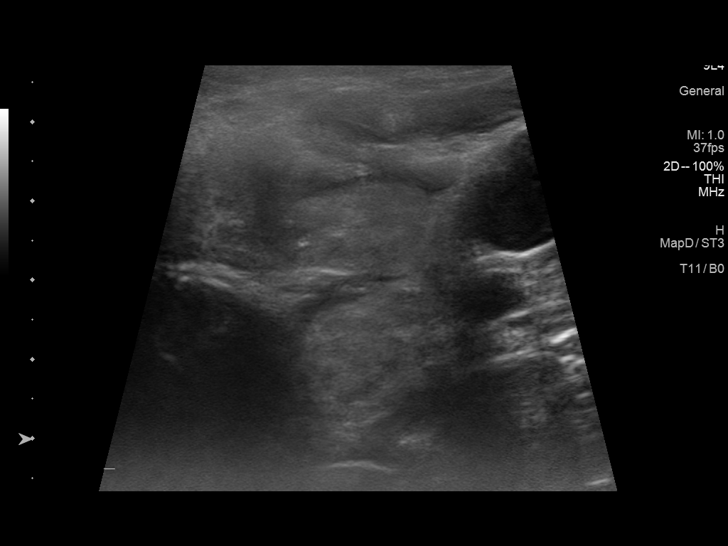

[12 of 12 positions shown; findings below may reference images not displayed]

Pre-procedural ultrasound scanning demonstrated unchanged size and
appearance of the indeterminate nodule within the left inferior
gland

The procedure was planned. The neck was prepped in the usual sterile
fashion, and a sterile drape was applied covering the operative
field. A timeout was performed prior to the initiation of the
procedure. Local anesthesia was provided with 1% lidocaine.

Under direct ultrasound guidance, 4 FNA biopsies were performed of
the nodule with a 22 gauge in site needle. Samples were reserved for
Afirma testing. Multiple ultrasound images were saved for procedural
documentation purposes. The samples were prepared and submitted to
pathology.

Limited post procedural scanning was negative for hematoma or
additional complication. Dressings were placed. The patient
tolerated the above procedures procedure well without immediate
postprocedural complication.
FINDINGS: Nodule reference number based on prior diagnostic ultrasound: 4

Maximum size: 2.4 cm

Location: Left; Inferior

ACR TI-RADS risk category: TR4 (4-6 points)

Reason for biopsy: meets ACR TI-RADS criteria

Ultrasound imaging confirms appropriate placement of the needles
within the thyroid nodule.
IMPRESSION: Technically successful ultrasound guided fine needle aspiration of
left inferior thyroid nodule.

## 2021-04-09 LAB — CYTOLOGY - NON PAP

## 2021-06-21 ENCOUNTER — Other Ambulatory Visit: Payer: Self-pay | Admitting: Family Medicine

## 2021-06-21 ENCOUNTER — Other Ambulatory Visit: Payer: Self-pay | Admitting: Gynecology

## 2021-06-21 DIAGNOSIS — M75102 Unspecified rotator cuff tear or rupture of left shoulder, not specified as traumatic: Secondary | ICD-10-CM

## 2021-06-21 DIAGNOSIS — M25512 Pain in left shoulder: Secondary | ICD-10-CM

## 2021-09-08 NOTE — Progress Notes (Deleted)
NEUROLOGY FOLLOW UP OFFICE NOTE  Angelica Carter [id number]  Assessment/Plan:   Subjective memory complaints - likely secondary to underlying generalized anxiety disorder, OSA and possible untreated ADHD; no evidence of neurodegenerative disease Syncope.  Neurologic workup negative.  *** Gait instability  residual from prior lumbar stenosis and surgeries  ***  Subjective:  Angelica Carter is a 66 year old female with COPD, lumbar spondylosis s/p 4 surgeries, kidney disease, anxiety and history of TIA who follows up for cognitive deficits, syncope and gait instability.  UPDATE: Last seen in initial consultation on 03/14/2020. Workup for memory deficits: Labs in November showed B12 540 and TSH 0.85.  Underwent neuropsychological evaluation on 05/03/2020 which was normal and symptoms felt to be related to underlying anxiety, attention and concentration difficulty (reported history of ADHD untreated) and OSA..  Workup for syncope:  EEG on 03/25/2020 was normal.   Carotid ultrasound on ***  She presented to the ED on 09/17/2020 for another episode of syncope.  When she woke up she endorsed headache left sided arm numbness and noted to not be able to left her left arm and leg.  CT and MRI of brain personally reviewed showed moderate chronic small vessel ischemic changes but no acute infarcts.  CTA of head and neck personally reviewed showed no LVO or hemodynamically significant stenosis.  Complicated migraine vs nonorganic etiology suspected.   -   HISTORY: Symptoms started many years ago.  She reports short-term memory problems.  She has word-finding difficulty or trouble getting words out.  She misplaces objects such as her keys in the house.  She may forget why she walked in a room.  It happens quite frequently.  She also reports frequent falls.  When she stands up, she feels weak in the arms and legs.  Often she needs to sit back down to rest and try again.  When she stands, her knees start to  buckle.  Because her feet feel weak, she has trouble keeping her foot on the pedal when driving.  She has chronic low back pain and nerve damage in her feet related to longstanding history of degenerative lumbar disease status post 4 surgeries with residual left leg pain and foot drop.  She has had episodes of syncope.  She had a cardiac workup earlier this year, including echocardiogram and event monitor, which were unremarkable.  She had an MRI of the brain without contrast on 04/18/2019 which was personally reviewed and demonstrated moderate chronic small vessel ischemic changes but otherwise no acute abnormality.  Several years ago, she was evaluated by neurologist while living in [place] who treated her for depression and anxiety.  PAST MEDICAL HISTORY: Past Medical History:  Diagnosis Date   ADHD (attention deficit hyperactivity disorder)    diagnosed in childhood; unspecified type but most likely inattentive   Allergy    Aortic atherosclerosis    Arthritis    Asthma    Chronic kidney disease (CKD)    born with only one kidney   COPD (chronic obstructive pulmonary disease)    Degenerative lumbar spinal stenosis 08/10/2016   Fibromyalgia    Generalized anxiety disorder    History of concussion    Left foot drop 07/14/2017   LLQ pain 02/08/2020   Low back pain 07/14/2017   Major depressive disorder    Obstructive sleep apnea    Not currently using CPAP machine as machine is broken; awaiting parts   TIA (transient ischemic attack) 2009   Tuberculosis  MEDICATIONS: Current Outpatient Medications on File Prior to Visit  Medication Sig Dispense Refill   amLODipine (NORVASC) 10 MG tablet Take 10 mg by mouth daily.     buPROPion (WELLBUTRIN XL) 150 MG 24 hr tablet Take 150 mg by mouth daily.     dicyclomine (BENTYL) 20 MG tablet Take 1 tablet (20 mg total) by mouth 3 (three) times daily as needed for spasms. 90 tablet 1   DIFLUCAN 150 MG tablet Take by mouth daily.      HYDROcodone-acetaminophen (NORCO/VICODIN) 5-325 MG tablet Take 1 tablet by mouth 2 (two) times daily.     ondansetron (ZOFRAN) 4 MG tablet Take 4 mg by mouth 3 (three) times daily as needed.     pantoprazole (PROTONIX) 40 MG tablet Take 40 mg by mouth daily.     pregabalin (LYRICA) 200 MG capsule Take 200 mg by mouth 3 (three) times daily.     SENNOSIDES-DOCUSATE SODIUM PO Take by mouth.     SUMAtriptan (IMITREX) 25 MG tablet Take 25 mg by mouth every 2 (two) hours as needed for migraine. May repeat in 2 hours if headache persists or recurs.     SYMBICORT 80-4.5 MCG/ACT inhaler Inhale 1 puff into the lungs 2 (two) times daily.     traZODone (DESYREL) 50 MG tablet Take 75 mg by mouth at bedtime.     No current facility-administered medications on file prior to visit.    ALLERGIES: Allergies  Allergen Reactions   Cyclobenzaprine Other (See Comments)    ADVERSE REACTION    FAMILY HISTORY: Family History  Problem Relation Age of Onset   Colon cancer Mother 52   Heart disease Father    Colon polyps Sister    Stomach cancer Neg Hx    Pancreatic cancer Neg Hx    Esophageal cancer Neg Hx       Objective:  *** General: No acute distress.  Patient appears ***-groomed.   Head:  Normocephalic/atraumatic Eyes:  Fundi examined but not visualized Neck: supple, no paraspinal tenderness, full range of motion Heart:  Regular rate and rhythm Lungs:  Clear to auscultation bilaterally Back: No paraspinal tenderness Neurological Exam: alert and oriented to person, place, and time.  Speech fluent and not dysarthric, language intact.  CN II-XII intact. Bulk and tone normal, muscle strength 5/5 throughout.  Sensation to light touch intact.  Deep tendon reflexes 2+ throughout, toes downgoing.  Finger to nose testing intact.  Gait normal, Romberg negative.   Metta Clines, DO  CC: ***

## 2021-09-09 ENCOUNTER — Ambulatory Visit: Payer: Medicare Other | Admitting: Neurology

## 2021-10-17 ENCOUNTER — Other Ambulatory Visit: Payer: Self-pay | Admitting: Student in an Organized Health Care Education/Training Program

## 2021-10-17 DIAGNOSIS — J410 Simple chronic bronchitis: Secondary | ICD-10-CM

## 2021-10-30 ENCOUNTER — Ambulatory Visit
Admission: RE | Admit: 2021-10-30 | Discharge: 2021-10-30 | Disposition: A | Discharge status: Home / Self Care | Payer: Medicare Other | Source: Ambulatory Visit | Attending: Student in an Organized Health Care Education/Training Program | Admitting: Student in an Organized Health Care Education/Training Program

## 2021-10-30 DIAGNOSIS — J410 Simple chronic bronchitis: Secondary | ICD-10-CM

## 2021-11-06 ENCOUNTER — Other Ambulatory Visit (HOSPITAL_COMMUNITY): Payer: Self-pay | Admitting: Family Medicine

## 2021-11-06 ENCOUNTER — Encounter (HOSPITAL_COMMUNITY): Payer: Self-pay

## 2021-11-06 ENCOUNTER — Emergency Department (HOSPITAL_COMMUNITY)
Admission: EM | Admit: 2021-11-06 | Discharge: 2021-11-06 | Disposition: A | Discharge status: Home / Self Care | Payer: Medicare Other | Attending: Emergency Medicine | Admitting: Emergency Medicine

## 2021-11-06 DIAGNOSIS — G8929 Other chronic pain: Secondary | ICD-10-CM | POA: Diagnosis not present

## 2021-11-06 DIAGNOSIS — M549 Dorsalgia, unspecified: Secondary | ICD-10-CM | POA: Diagnosis present

## 2021-11-06 DIAGNOSIS — Z7952 Long term (current) use of systemic steroids: Secondary | ICD-10-CM | POA: Insufficient documentation

## 2021-11-06 DIAGNOSIS — M545 Low back pain, unspecified: Secondary | ICD-10-CM | POA: Insufficient documentation

## 2021-11-06 DIAGNOSIS — J449 Chronic obstructive pulmonary disease, unspecified: Secondary | ICD-10-CM | POA: Diagnosis not present

## 2021-11-06 DIAGNOSIS — Z7951 Long term (current) use of inhaled steroids: Secondary | ICD-10-CM | POA: Diagnosis not present

## 2021-11-06 DIAGNOSIS — N189 Chronic kidney disease, unspecified: Secondary | ICD-10-CM | POA: Diagnosis not present

## 2021-11-06 DIAGNOSIS — I25119 Atherosclerotic heart disease of native coronary artery with unspecified angina pectoris: Secondary | ICD-10-CM

## 2021-11-06 MED ORDER — PREDNISONE 10 MG (21) PO TBPK: ORAL_TABLET | Freq: Every day | ORAL | 0 refills | Status: DC

## 2021-11-06 MED ORDER — DEXAMETHASONE SODIUM PHOSPHATE 10 MG/ML IJ SOLN
10.0000 mg | Freq: Once | INTRAMUSCULAR | Status: AC
  Administered 2021-11-06: 10 mg via INTRAMUSCULAR
  Filled 2021-11-06: qty 1

## 2021-11-06 NOTE — Discharge Instructions (Signed)
You were seen today due to a worsening of your low back pain.  You were given a steroid injection here at the hospital and has been prescribed a steroid Dosepak.  You may continue taking your other prescription medications as prescribed.  Please follow up with your provider at [facility] and keep your upcoming MRI appointment.  If you develop urinary incontinence, retention, or numbness in the groin region, please return for reevaluation.

## 2021-11-06 NOTE — ED Provider Notes (Signed)
[facility] DEPT Provider Note   CSN: [id number] Arrival date & time: 11/06/21  2683     History  Chief Complaint  Patient presents with   Back Pain    Angelica Carter is a 66 y.o. female.  Patient presents with an increase in her chronic back pain.  Patient states that she is currently followed by [facility] for her back pain.  They have her on hydrocodone with plans for an MRI on Tuesday.  Patient states that the hydrocodone "is not cutting it" and that her pain is increased.  She has history of multiple previous back surgeries that were performed in [place] before she moved to [place].  Patient states that she has no incontinence, no urinary retention, no saddle anesthesia, no new trauma, or injury.  Patient's symptom is pain which is increased over the past few days.  Past medical history significant for degenerative lumbar spinal stenosis, left-sided foot drop, arthritis, COPD, aortic atherosclerosis, MDD, TIAs, fibromyalgia, CKD, generalized anxiety disorder, multiple back surgeries  HPI     Home Medications Prior to Admission medications   Medication Sig Start Date End Date Taking? Authorizing Provider  predniSONE (STERAPRED UNI-PAK 21 TAB) 10 MG (21) TBPK tablet Take by mouth daily. Take 6 tabs by mouth daily  for 2 days, then 5 tabs for 2 days, then 4 tabs for 2 days, then 3 tabs for 2 days, 2 tabs for 2 days, then 1 tab by mouth daily for 2 days 11/06/21  Yes Cherlynn June B, PA-C  amLODipine (NORVASC) 10 MG tablet Take 10 mg by mouth daily.    [provider]  buPROPion (WELLBUTRIN XL) 150 MG 24 hr tablet Take 150 mg by mouth daily. 08/17/19   [provider]  dicyclomine (BENTYL) 20 MG tablet Take 1 tablet (20 mg total) by mouth 3 (three) times daily as needed for spasms. 04/02/20   Armbruster, Carlota Raspberry, MD  DIFLUCAN 150 MG tablet Take by mouth daily. 02/07/20   [provider]  HYDROcodone-acetaminophen  (NORCO/VICODIN) 5-325 MG tablet Take 1 tablet by mouth 2 (two) times daily. 01/24/20   [provider]  ondansetron (ZOFRAN) 4 MG tablet Take 4 mg by mouth 3 (three) times daily as needed. 10/27/19   [provider]  pantoprazole (PROTONIX) 40 MG tablet Take 40 mg by mouth daily. 10/27/19   [provider]  pregabalin (LYRICA) 200 MG capsule Take 200 mg by mouth 3 (three) times daily.    [provider]  SENNOSIDES-DOCUSATE SODIUM PO Take by mouth.    [provider]  SUMAtriptan (IMITREX) 25 MG tablet Take 25 mg by mouth every 2 (two) hours as needed for migraine. May repeat in 2 hours if headache persists or recurs.    [provider]  SYMBICORT 80-4.5 MCG/ACT inhaler Inhale 1 puff into the lungs 2 (two) times daily. 08/17/19   [provider]  traZODone (DESYREL) 50 MG tablet Take 75 mg by mouth at bedtime.    [provider]      Allergies    Cyclobenzaprine    Review of Systems   Review of Systems  Constitutional:  Negative for fever.  Respiratory:  Negative for shortness of breath.   Cardiovascular:  Negative for chest pain.  Gastrointestinal:  Negative for abdominal pain, diarrhea, nausea and vomiting.  Genitourinary:  Negative for difficulty urinating and dysuria.  Neurological:  Negative for facial asymmetry, weakness and numbness.    Physical Exam Updated Vital Signs  BP (!) 159/105 (BP Location: Left Arm)   Pulse 89   Temp 99.3 F (37.4 C) (Oral)   Resp 14   SpO2 96%  Physical Exam Vitals and nursing note reviewed.  Constitutional:      General: She is not in acute distress. HENT:     Head: Normocephalic and atraumatic.     Mouth/Throat:     Mouth: Mucous membranes are moist.  Eyes:     Extraocular Movements: Extraocular movements intact.     Conjunctiva/sclera: Conjunctivae normal.     Pupils: Pupils are equal, round, and reactive to light.  Cardiovascular:     Rate and Rhythm: Normal rate and  regular rhythm.     Pulses: Normal pulses.  Pulmonary:     Effort: Pulmonary effort is normal.     Breath sounds: Normal breath sounds.  Abdominal:     Palpations: Abdomen is soft.     Tenderness: There is no abdominal tenderness.  Musculoskeletal:        General: Normal range of motion.     Cervical back: Normal range of motion and neck supple.  Skin:    General: Skin is warm and dry.  Neurological:     General: No focal deficit present.     Mental Status: She is alert and oriented to person, place, and time.     ED Results / Procedures / Treatments   Labs (all labs ordered are listed, but only abnormal results are displayed) Labs Reviewed - No data to display  EKG None  Radiology No results found.  Procedures Procedures    Medications Ordered in ED Medications  dexamethasone (DECADRON) injection 10 mg (10 mg Intramuscular Given 11/06/21 1007)    ED Course/ Medical Decision Making/ A&P                           Medical Decision Making Risk Prescription drug management.   Patient presents with a worsening of her chronic back pain.  No new red flag symptoms such as saddle anesthesia, trauma, urinary incontinence or retention  I reviewed patient's medical records which show previous visit for strokelike symptoms thought to be psychogenic, and visits with [facility] for chronic back pain.  I see no indication at this time for new imaging.  Patient is scheduled for outpatient MRI on Tuesday.  I see no utility in labs at today's visit.  Patient has a grossly normal neuro exam.  Chief complaint is increased pain from known lower back degenerative issues.  Patient is currently talking to orthopedics about changing her hydrocodone to oxycodone.  Patient states she is unable to take muscle relaxants due to intolerance.  Plan to discharge patient home with a steroid Dosepak.  I did order a Decadron injection while here at the hospital.  The patient did seem somewhat  better after reassessment.  This will hopefully give her some relief and calm inflammation until she is able to get her outpatient MRI.  I do recommend that she follow-up with [facility] for further pain management as they are the ones prescribing her hydrocodone.Discharge home        Final Clinical Impression(s) / ED Diagnoses Final diagnoses:  Chronic low back pain, unspecified back pain laterality, unspecified whether sciatica present    Rx / DC Orders ED Discharge Orders          Ordered    predniSONE (STERAPRED UNI-PAK 21 TAB) 10 MG (21) TBPK tablet  Daily  11/06/21 [place], Sierah Lacewell B, PA-C 11/06/21 1026    Lacretia Leigh, MD 11/06/21 1042

## 2021-11-06 NOTE — ED Triage Notes (Signed)
Pt states that she has had increasing back pain over the last several days. Pt has hx of several back surgeries. Denies urinary or bowel incontinence. No saddle anesthesia. Pt prescribed hydrocodone for same and unable to obtain relief at home.

## 2021-11-20 ENCOUNTER — Ambulatory Visit (HOSPITAL_COMMUNITY): Payer: Medicare Other | Attending: Cardiology

## 2021-11-20 DIAGNOSIS — I25119 Atherosclerotic heart disease of native coronary artery with unspecified angina pectoris: Secondary | ICD-10-CM | POA: Diagnosis present

## 2021-11-20 LAB — ECHOCARDIOGRAM COMPLETE
Area-P 1/2: 4.02 cm2
S' Lateral: 2.7 cm

## 2021-12-16 ENCOUNTER — Ambulatory Visit
Admission: RE | Admit: 2021-12-16 | Discharge: 2021-12-16 | Disposition: A | Discharge status: Home / Self Care | Payer: Medicare Other | Source: Ambulatory Visit | Attending: Family Medicine | Admitting: Family Medicine

## 2021-12-16 ENCOUNTER — Other Ambulatory Visit: Payer: Self-pay | Admitting: Family Medicine

## 2021-12-16 DIAGNOSIS — R918 Other nonspecific abnormal finding of lung field: Secondary | ICD-10-CM

## 2021-12-16 DIAGNOSIS — R079 Chest pain, unspecified: Secondary | ICD-10-CM

## 2021-12-24 ENCOUNTER — Other Ambulatory Visit: Payer: Self-pay | Admitting: Family Medicine

## 2021-12-24 DIAGNOSIS — R918 Other nonspecific abnormal finding of lung field: Secondary | ICD-10-CM

## 2021-12-24 DIAGNOSIS — I700 Atherosclerosis of aorta: Secondary | ICD-10-CM

## 2021-12-24 DIAGNOSIS — R079 Chest pain, unspecified: Secondary | ICD-10-CM

## 2021-12-24 DIAGNOSIS — I2510 Atherosclerotic heart disease of native coronary artery without angina pectoris: Secondary | ICD-10-CM

## 2022-01-05 ENCOUNTER — Other Ambulatory Visit (HOSPITAL_COMMUNITY): Payer: Self-pay | Admitting: Family Medicine

## 2022-01-05 DIAGNOSIS — I471 Supraventricular tachycardia, unspecified: Secondary | ICD-10-CM

## 2022-01-05 DIAGNOSIS — I2510 Atherosclerotic heart disease of native coronary artery without angina pectoris: Secondary | ICD-10-CM

## 2022-01-05 DIAGNOSIS — I5032 Chronic diastolic (congestive) heart failure: Secondary | ICD-10-CM

## 2022-01-05 DIAGNOSIS — R9431 Abnormal electrocardiogram [ECG] [EKG]: Secondary | ICD-10-CM

## 2022-01-05 DIAGNOSIS — I272 Pulmonary hypertension, unspecified: Secondary | ICD-10-CM

## 2022-01-13 ENCOUNTER — Encounter (HOSPITAL_COMMUNITY): Payer: Self-pay | Admitting: Family Medicine

## 2022-01-19 ENCOUNTER — Other Ambulatory Visit: Payer: Medicare Other

## 2022-01-21 ENCOUNTER — Other Ambulatory Visit: Payer: Self-pay | Admitting: Family Medicine

## 2022-01-21 DIAGNOSIS — Z87891 Personal history of nicotine dependence: Secondary | ICD-10-CM

## 2022-01-21 DIAGNOSIS — Z122 Encounter for screening for malignant neoplasm of respiratory organs: Secondary | ICD-10-CM

## 2022-01-27 ENCOUNTER — Telehealth (HOSPITAL_COMMUNITY): Payer: Self-pay | Admitting: Family Medicine

## 2022-01-27 NOTE — Telephone Encounter (Signed)
Just an FYI. We have made several attempts to contact this patient including sending a letter to schedule or reschedule their Myoview. We will be removing the patient from the echo/nuc WQ.    MAILED LETTER [facility]  01/13/22 [facility] to schedule x 3 @ 10:42/LBW  01/08/22 [facility] to schedule x 2 @ 10:19/LBW  01/05/22 [facility] to schedule @ 1:39/LBW        Thank you

## 2022-02-04 ENCOUNTER — Ambulatory Visit: Payer: Medicare Other | Admitting: Neurology

## 2022-02-18 ENCOUNTER — Other Ambulatory Visit: Payer: Self-pay | Admitting: Family Medicine

## 2022-02-18 DIAGNOSIS — R918 Other nonspecific abnormal finding of lung field: Secondary | ICD-10-CM

## 2022-02-19 ENCOUNTER — Other Ambulatory Visit: Payer: Self-pay | Admitting: Internal Medicine

## 2022-02-19 ENCOUNTER — Ambulatory Visit
Admission: RE | Admit: 2022-02-19 | Discharge: 2022-02-19 | Disposition: A | Discharge status: Home / Self Care | Payer: Medicare Other | Source: Ambulatory Visit | Attending: Internal Medicine | Admitting: Internal Medicine

## 2022-02-19 DIAGNOSIS — R1013 Epigastric pain: Secondary | ICD-10-CM

## 2022-02-19 DIAGNOSIS — R1012 Left upper quadrant pain: Secondary | ICD-10-CM

## 2022-02-20 ENCOUNTER — Other Ambulatory Visit: Payer: Self-pay | Admitting: Internal Medicine

## 2022-02-20 DIAGNOSIS — R1012 Left upper quadrant pain: Secondary | ICD-10-CM

## 2022-02-20 DIAGNOSIS — K5900 Constipation, unspecified: Secondary | ICD-10-CM

## 2022-03-10 ENCOUNTER — Ambulatory Visit: Payer: Medicare Other | Admitting: Neurology

## 2022-08-14 ENCOUNTER — Encounter: Payer: Self-pay | Admitting: Gastroenterology

## 2022-12-09 ENCOUNTER — Ambulatory Visit: Payer: Medicare HMO | Admitting: Podiatry

## 2022-12-21 ENCOUNTER — Ambulatory Visit: Payer: Medicare HMO | Admitting: Podiatry

## 2023-03-10 ENCOUNTER — Encounter (HOSPITAL_BASED_OUTPATIENT_CLINIC_OR_DEPARTMENT_OTHER): Payer: Self-pay | Admitting: Physical Therapy

## 2023-03-10 ENCOUNTER — Ambulatory Visit (HOSPITAL_BASED_OUTPATIENT_CLINIC_OR_DEPARTMENT_OTHER): Payer: Medicare HMO | Attending: Family | Admitting: Physical Therapy

## 2023-03-10 ENCOUNTER — Other Ambulatory Visit: Payer: Self-pay

## 2023-03-10 DIAGNOSIS — M542 Cervicalgia: Secondary | ICD-10-CM | POA: Diagnosis not present

## 2023-03-10 DIAGNOSIS — M25512 Pain in left shoulder: Secondary | ICD-10-CM | POA: Insufficient documentation

## 2023-03-10 DIAGNOSIS — M6281 Muscle weakness (generalized): Secondary | ICD-10-CM | POA: Diagnosis not present

## 2023-03-10 DIAGNOSIS — G8929 Other chronic pain: Secondary | ICD-10-CM | POA: Diagnosis present

## 2023-03-10 NOTE — Therapy (Signed)
OUTPATIENT PHYSICAL THERAPY CERVICAL EVALUATION   Patient Name: Angelica Carter MRN: [id number] DOB:November 15, 1955, 67 y.o., female Today's Date: 03/10/2023  END OF SESSION:  PT End of Session - 03/10/23 1330     Visit Number 1    Number of Visits 16    Date for PT Re-Evaluation 05/07/23    Progress Note Due on Visit 10    PT Start Time 1202    PT Stop Time 1240    PT Time Calculation (min) 38 min    Activity Tolerance Patient tolerated treatment well;Patient limited by pain    Behavior During Therapy [facility] for tasks assessed/performed             Past Medical History:  Diagnosis Date   ADHD (attention deficit hyperactivity disorder)    diagnosed in childhood; unspecified type but most likely inattentive   Allergy    Aortic atherosclerosis    Arthritis    Asthma    Chronic diastolic heart failure (HCC)    Chronic kidney disease (CKD)    born with only one kidney   COPD (chronic obstructive pulmonary disease)    Degenerative lumbar spinal stenosis 08/10/2016   Fibromyalgia    Generalized anxiety disorder    History of concussion    Left foot drop 07/14/2017   LLQ pain 02/08/2020   Low back pain 07/14/2017   Major depressive disorder    Obstructive sleep apnea    Not currently using CPAP machine as machine is broken; awaiting parts   Paroxysmal supraventricular tachycardia (HCC)    TIA (transient ischemic attack) 2009   Tuberculosis    Past Surgical History:  Procedure Laterality Date   BACK SURGERY     x 4    TUBAL LIGATION     Patient Active Problem List   Diagnosis Date Noted   Aortic atherosclerosis    Generalized anxiety disorder    Major depressive disorder    ADHD (attention deficit hyperactivity disorder)    TIA (transient ischemic attack)    Fibromyalgia    History of concussion    Chronic kidney disease (CKD)    COPD (chronic obstructive pulmonary disease)    Obstructive sleep apnea    LLQ pain 02/08/2020   Diarrhea 02/08/2020   Left foot drop  07/14/2017   Low back pain 07/14/2017   Degenerative lumbar spinal stenosis 08/10/2016    PCP: Vladimir Crofts, FNP  REFERRING PROVIDER: Vladimir Crofts, FNP   REFERRING DIAG:   Cervicalgia  M25.512 (ICD-10-CM) - Pain in left shoulder    THERAPY DIAG:  Chronic left shoulder pain  Cervicalgia  Muscle weakness (generalized)  Rationale for Evaluation and Treatment: Rehabilitation  ONSET DATE: 2 years  SUBJECTIVE:  SUBJECTIVE STATEMENT: Pt reports she has had 4 back surgeries up in [place] from 2003-2018, she is fused but does not remember what exactly they did.  Neck and shoulder pain began a few months ago.  Have not had MRI.  Just x-ray. It is my left side and I am left handed.  Had water therapy for my low back up [place] which is why I requested  Hand dominance: Left  PERTINENT HISTORY:  Pt reports fibromyalgia  PAIN:  Are you having pain? Yes: NPRS scale: 8/10 Pain location: left ant shoulder Pain description: sharpe/ache Aggravating factors: movement Relieving factors: rest, meds  Cervical spine: pain varies current: 3/10  PRECAUTIONS: Shoulder  RED FLAGS: None     WEIGHT BEARING RESTRICTIONS: No  FALLS:  Has patient fallen in last 6 months? No  LIVING ENVIRONMENT: Lives with: lives with their spouse Lives in: House/apartment Stairs: Yes: External: 7 steps; on right going up Has following equipment at home: None  OCCUPATION: retired  PLOF: Independent  PATIENT GOALS: no surgery needed in my shoulder; decrease pain, be able to move it  NEXT MD VISIT: as needed  OBJECTIVE:  Note: Objective measures were completed at Evaluation unless otherwise noted.  DIAGNOSTIC FINDINGS:  MRI x 2-3 yrs: Chronic back pain mechanical secondary lumbar spondylosis adjacent  segment disease above a long fusion L2 to the sacrum.. No focal neurologic deficit   PATIENT SURVEYS:  FOTO Primary score 36% with goal 53%  COGNITION: Overall cognitive status: Within functional limits for tasks assessed   POSTURE: weight shift right left shoulder depressed vs right  PALPATION: Significant TTP about rotator cuff insertion, left scapular area  CERVICAL ROM:   Active ROM A/PROM (deg) eval  Flexion full  Extension full  Right lateral flexion 25% limited P!  Left lateral flexion Full P!  Right rotation Limited 50% P!  Left rotation full   (Blank rows = not tested)  UPPER EXTREMITY ROM:  LUE wfl RUE shoulder assisted abd ~85d; abd 30d pain limiting; Int shoulder rot: full; external rotation to neutral  UPPER EXTREMITY MMT:  Right wfl Left: elbow 4/5; wrist and forearm 5/5; shoulder 3-  SHOULDER SPECIAL TESTS: Rotator cuff assessment: Drop arm test:pt unable to tolerate due to pain Biceps assessment:   Yergason test left positive   TODAY'S TREATMENT:                                                                                                                              Eval testing  PATIENT EDUCATION:  Education details: Discussed eval findings, rehab rationale, aquatic program progression/POC and pools in area. Patient is in agreement  Person educated: Patient Education method: Explanation Education comprehension: verbalized understanding  HOME EXERCISE PROGRAM: TBA  ASSESSMENT:  CLINICAL IMPRESSION: Patient is a 67 y.o. f who was seen today for physical therapy evaluation and treatment for cervalgia and left shoulder pain.  She has had cervical imagining done at [facility] but does  not have access to results. Cervical pain primarily left sided middle and upper trap with radiating pain into left wrist. Significant muscle tightness throughout entire cervical region.  Left shoulder pain highly sensitive.  Testing suggests a bursitis and possible  rotator cuff injury.  She did not tolerate shoulder testing well limited by pain. No diagnostics completed on shoulder in past year+. It was suggested in notes in March 2023 that she had similar issue with left shoulder although she reports she never received any treatment for it.  Recently the pain has gotten significantly worse with limitations completing ADL's and house hold chores using her left/dominant.   I did suggest she may want to consider seeing ortho for formal assessment and dx.  She will benefit from skilled physical therapy land based (dx not treated well in aquatics) to improve  movement and function of pt LUE as well as treatment for cervalgia.  OBJECTIVE IMPAIRMENTS: decreased ROM, decreased strength, impaired UE functional use, and pain.   ACTIVITY LIMITATIONS: carrying, lifting, dressing, and reach over head  PARTICIPATION LIMITATIONS: cleaning  REHAB POTENTIAL: Good  CLINICAL DECISION MAKING: Evolving/moderate complexity  EVALUATION COMPLEXITY: Moderate   GOALS: Goals reviewed with patient? Yes  SHORT TERM GOALS: Target date: 04/11/23  Pt will be indep and compliant with HEP Baseline:  Goal status: INITIAL  2.  Pt to be seen by orthopedic with potential diagnostics completed of shoulder Baseline:  Goal status: INITIAL  3.  Pt to improve left shoulder active flex to 90d Baseline:  Goal status: INITIAL  4.  Pt to have full cervical ROM without pain limitation Baseline:  Goal status: INITIAL     LONG TERM GOALS: Target date: 05/07/23  Pt to meet stated Foto Goal of 53% Baseline: 36% Goal status: INITIAL  2. Left shoulder strength improved to 3+ or> to demonstrate improved mobility Baseline:  Goal status: INITIAL  3.  Pt will be indep with self care/ADLs reaching up over head wash and groom Baseline:  Goal status: INITIAL  4.  Pt will decrease left shoulder and cervical pain by using ice/heat reporting decreased pain by 3-4  NPRS   Baseline:8/10   Goal Status: INITIAL    PLAN:  PT FREQUENCY: 1-2x/week  PT DURATION: 8 weeks  PLANNED INTERVENTIONS: 97164- PT Re-evaluation, 97110-Therapeutic exercises, 97530- Therapeutic activity, 97112- Neuromuscular re-education, 97535- Self Care, [id number]- Manual therapy, [id number]- Aquatic Therapy, 97760- Splinting, 97014- Electrical stimulation (unattended), 97033- Ionotophoresis 4mg /ml Dexamethasone, Patient/Family education, Taping, Dry Needling, Joint mobilization, DME instructions, Cryotherapy, and Moist heat  PLAN FOR NEXT SESSION: Land based:cervical &  left shoulder/ scapular ROM, strengthening; pain management; continued assessment   Geni Bers, PT 03/10/2023, 1:33 PM

## 2023-04-01 ENCOUNTER — Encounter (HOSPITAL_BASED_OUTPATIENT_CLINIC_OR_DEPARTMENT_OTHER): Payer: Self-pay | Admitting: Physical Therapy

## 2023-04-01 ENCOUNTER — Ambulatory Visit (HOSPITAL_BASED_OUTPATIENT_CLINIC_OR_DEPARTMENT_OTHER): Payer: Medicare HMO | Attending: Family | Admitting: Physical Therapy

## 2023-04-01 DIAGNOSIS — M6281 Muscle weakness (generalized): Secondary | ICD-10-CM | POA: Diagnosis present

## 2023-04-01 DIAGNOSIS — G8929 Other chronic pain: Secondary | ICD-10-CM

## 2023-04-01 DIAGNOSIS — M542 Cervicalgia: Secondary | ICD-10-CM

## 2023-04-01 DIAGNOSIS — M25512 Pain in left shoulder: Secondary | ICD-10-CM | POA: Insufficient documentation

## 2023-04-01 NOTE — Therapy (Signed)
OUTPATIENT PHYSICAL THERAPY CERVICAL EVALUATION   Patient Name: Angelica Carter MRN: [id number] DOB:[birth year], 67 y.o., female Today's Date: 04/01/2023  END OF SESSION:  PT End of Session - 04/01/23 1602     Visit Number 2    Number of Visits 16    Date for PT Re-Evaluation 05/07/23    Progress Note Due on Visit 10    PT Start Time 1600    PT Stop Time 1639    PT Time Calculation (min) 39 min    Activity Tolerance Patient tolerated treatment well;Patient limited by pain    Behavior During Therapy [facility] for tasks assessed/performed              Past Medical History:  Diagnosis Date   ADHD (attention deficit hyperactivity disorder)    diagnosed in childhood; unspecified type but most likely inattentive   Allergy    Aortic atherosclerosis    Arthritis    Asthma    Chronic diastolic heart failure (HCC)    Chronic kidney disease (CKD)    born with only one kidney   COPD (chronic obstructive pulmonary disease)    Degenerative lumbar spinal stenosis 08/10/2016   Fibromyalgia    Generalized anxiety disorder    History of concussion    Left foot drop 07/14/2017   LLQ pain 02/08/2020   Low back pain 07/14/2017   Major depressive disorder    Obstructive sleep apnea    Not currently using CPAP machine as machine is broken; awaiting parts   Paroxysmal supraventricular tachycardia (HCC)    TIA (transient ischemic attack) 2009   Tuberculosis    Past Surgical History:  Procedure Laterality Date   BACK SURGERY     x 4    TUBAL LIGATION     Patient Active Problem List   Diagnosis Date Noted   Aortic atherosclerosis    Generalized anxiety disorder    Major depressive disorder    ADHD (attention deficit hyperactivity disorder)    TIA (transient ischemic attack)    Fibromyalgia    History of concussion    Chronic kidney disease (CKD)    COPD (chronic obstructive pulmonary disease)    Obstructive sleep apnea    LLQ pain 02/08/2020   Diarrhea 02/08/2020   Left foot drop  07/14/2017   Low back pain 07/14/2017   Degenerative lumbar spinal stenosis 08/10/2016    PCP: Vladimir Crofts, FNP  REFERRING PROVIDER: Vladimir Crofts, FNP   REFERRING DIAG:   Cervicalgia  M25.512 (ICD-10-CM) - Pain in left shoulder    THERAPY DIAG:  Chronic left shoulder pain  Cervicalgia  Muscle weakness (generalized)  Rationale for Evaluation and Treatment: Rehabilitation  ONSET DATE: 2 years  SUBJECTIVE:  SUBJECTIVE STATEMENT: I didn't get much sleep last night due to pain.   Hand dominance: Left  PERTINENT HISTORY:  Pt reports fibromyalgia  PAIN:  Are you having pain? Yes: NPRS scale: 8/10 Pain location: left ant shoulder Pain description: sharpe/ache Aggravating factors: movement Relieving factors: rest, meds  Cervical spine: pain varies current: 3/10  PRECAUTIONS: Shoulder  RED FLAGS: None     WEIGHT BEARING RESTRICTIONS: No  FALLS:  Has patient fallen in last 6 months? No  LIVING ENVIRONMENT: Lives with: lives with their spouse Lives in: House/apartment Stairs: Yes: External: 7 steps; on right going up Has following equipment at home: None  OCCUPATION: retired  PLOF: Independent  PATIENT GOALS: no surgery needed in my shoulder; decrease pain, be able to move it  NEXT MD VISIT: as needed  OBJECTIVE:  Note: Objective measures were completed at Evaluation unless otherwise noted.  DIAGNOSTIC FINDINGS:  MRI x 2-3 yrs: Chronic back pain mechanical secondary lumbar spondylosis adjacent segment disease above a long fusion L2 to the sacrum.. No focal neurologic deficit   PATIENT SURVEYS:  FOTO Primary score 36% with goal 53%  COGNITION: Overall cognitive status: Within functional limits for tasks assessed   POSTURE: weight shift right left  shoulder depressed vs right  PALPATION: Significant TTP about rotator cuff insertion, left scapular area  CERVICAL ROM:   Active ROM A/PROM (deg) eval  Flexion full  Extension full  Right lateral flexion 25% limited P!  Left lateral flexion Full P!  Right rotation Limited 50% P!  Left rotation full   (Blank rows = not tested)  UPPER EXTREMITY ROM:  LUE wfl RUE shoulder assisted abd ~85d; abd 30d pain limiting; Int shoulder rot: full; external rotation to neutral  UPPER EXTREMITY MMT:  Right wfl Left: elbow 4/5; wrist and forearm 5/5; shoulder 3-  SHOULDER SPECIAL TESTS: Rotator cuff assessment: Drop arm test:pt unable to tolerate due to pain Biceps assessment:   Yergason test left positive   TODAY'S TREATMENT:                                                                                                                              Treatment                            12/5:  PROM of shoulder is full and painful when guarded but able to move through full ROM on repeated motions STM Lt upper trap Supine chest press with bar, cues to maintain chin tuck Seated postural alignment With posture: +cervical rotation, +shoulder rolls, +upper trap stretch, +GHJ ER Pulleys flexion Lt stretch at counter  Standing upright posture, also with gait   PATIENT EDUCATION:  Education details: Discussed eval findings, rehab rationale, aquatic program progression/POC and pools in area. Patient is in agreement  Person educated: Patient Education method: Explanation Education comprehension: verbalized understanding  HOME EXERCISE PROGRAM: [id number]  ASSESSMENT:  CLINICAL IMPRESSION:  Heavy focus on beginning postural alignment and relaxing from guarding. Denied pain increase with tx.   OBJECTIVE IMPAIRMENTS: decreased ROM, decreased strength, impaired UE functional use, and pain.   ACTIVITY LIMITATIONS: carrying, lifting, dressing, and reach over head  PARTICIPATION LIMITATIONS:  cleaning  REHAB POTENTIAL: Good  CLINICAL DECISION MAKING: Evolving/moderate complexity  EVALUATION COMPLEXITY: Moderate   GOALS: Goals reviewed with patient? Yes  SHORT TERM GOALS: Target date: 04/11/23  Pt will be indep and compliant with HEP Baseline:  Goal status: INITIAL  2.  Pt to be seen by orthopedic with potential diagnostics completed of shoulder Baseline:  Goal status: INITIAL  3.  Pt to improve left shoulder active flex to 90d Baseline:  Goal status: INITIAL  4.  Pt to have full cervical ROM without pain limitation Baseline:  Goal status: INITIAL     LONG TERM GOALS: Target date: 05/07/23  Pt to meet stated Foto Goal of 53% Baseline: 36% Goal status: INITIAL  2. Left shoulder strength improved to 3+ or> to demonstrate improved mobility Baseline:  Goal status: INITIAL  3.  Pt will be indep with self care/ADLs reaching up over head wash and groom Baseline:  Goal status: INITIAL  4.  Pt will decrease left shoulder and cervical pain by using ice/heat reporting decreased pain by 3-4 NPRS   Baseline:8/10   Goal Status: INITIAL    PLAN:  PT FREQUENCY: 1-2x/week  PT DURATION: 8 weeks  PLANNED INTERVENTIONS: 97164- PT Re-evaluation, 97110-Therapeutic exercises, 97530- Therapeutic activity, [id number]- Neuromuscular re-education, 97535- Self Care, 82956- Manual therapy, [id number]- Aquatic Therapy, 97760- Splinting, 97014- Electrical stimulation (unattended), 97033- Ionotophoresis 4mg /ml Dexamethasone, Patient/Family education, Taping, Dry Needling, Joint mobilization, DME instructions, Cryotherapy, and Moist heat  PLAN FOR NEXT SESSION: Land based:cervical &  left shoulder/ scapular ROM, strengthening; pain management; continued assessment  Alexismarie Flaim C. Bartley Vuolo PT, DPT 04/01/23 4:40 PM

## 2023-04-07 ENCOUNTER — Encounter (HOSPITAL_BASED_OUTPATIENT_CLINIC_OR_DEPARTMENT_OTHER): Payer: Self-pay

## 2023-04-07 ENCOUNTER — Ambulatory Visit (HOSPITAL_BASED_OUTPATIENT_CLINIC_OR_DEPARTMENT_OTHER): Payer: Medicare HMO

## 2023-04-07 DIAGNOSIS — M6281 Muscle weakness (generalized): Secondary | ICD-10-CM

## 2023-04-07 DIAGNOSIS — M25512 Pain in left shoulder: Secondary | ICD-10-CM | POA: Diagnosis not present

## 2023-04-07 DIAGNOSIS — M542 Cervicalgia: Secondary | ICD-10-CM

## 2023-04-07 DIAGNOSIS — G8929 Other chronic pain: Secondary | ICD-10-CM

## 2023-04-07 NOTE — Therapy (Signed)
OUTPATIENT PHYSICAL THERAPY CERVICAL EVALUATION   Patient Name: Angelica Carter MRN: [id number] DOB:08-Apr-1956, 67 y.o., female Today's Date: 04/07/2023  END OF SESSION:  PT End of Session - 04/07/23 1121     Visit Number 3    Number of Visits 16    Date for PT Re-Evaluation 05/07/23    Progress Note Due on Visit 10    PT Start Time 1103    PT Stop Time 1147    PT Time Calculation (min) 44 min    Activity Tolerance Patient tolerated treatment well    Behavior During Therapy WFL for tasks assessed/performed               Past Medical History:  Diagnosis Date   ADHD (attention deficit hyperactivity disorder)    diagnosed in childhood; unspecified type but most likely inattentive   Allergy    Aortic atherosclerosis    Arthritis    Asthma    Chronic diastolic heart failure (HCC)    Chronic kidney disease (CKD)    born with only one kidney   COPD (chronic obstructive pulmonary disease)    Degenerative lumbar spinal stenosis 08/10/2016   Fibromyalgia    Generalized anxiety disorder    History of concussion    Left foot drop 07/14/2017   LLQ pain 02/08/2020   Low back pain 07/14/2017   Major depressive disorder    Obstructive sleep apnea    Not currently using CPAP machine as machine is broken; awaiting parts   Paroxysmal supraventricular tachycardia (HCC)    TIA (transient ischemic attack) 2009   Tuberculosis    Past Surgical History:  Procedure Laterality Date   BACK SURGERY     x 4    TUBAL LIGATION     Patient Active Problem List   Diagnosis Date Noted   Aortic atherosclerosis    Generalized anxiety disorder    Major depressive disorder    ADHD (attention deficit hyperactivity disorder)    TIA (transient ischemic attack)    Fibromyalgia    History of concussion    Chronic kidney disease (CKD)    COPD (chronic obstructive pulmonary disease)    Obstructive sleep apnea    LLQ pain 02/08/2020   Diarrhea 02/08/2020   Left foot drop 07/14/2017   Low back  pain 07/14/2017   Degenerative lumbar spinal stenosis 08/10/2016    PCP: Vladimir Crofts, FNP  REFERRING PROVIDER: Vladimir Crofts, FNP   REFERRING DIAG:   Cervicalgia  M25.512 (ICD-10-CM) - Pain in left shoulder    THERAPY DIAG:  Chronic left shoulder pain  Cervicalgia  Muscle weakness (generalized)  Rationale for Evaluation and Treatment: Rehabilitation  ONSET DATE: 2 years  SUBJECTIVE:  SUBJECTIVE STATEMENT: Pt reports compliance with HEP. "It helped when she released the muscle." Had a few days of relief but did start hurting again a couple days ago.  Hand dominance: Left  PERTINENT HISTORY:  Pt reports fibromyalgia  PAIN:  Are you having pain? Yes: NPRS scale: 7/10 Pain location: left ant shoulder Pain description: sharpe/ache Aggravating factors: movement Relieving factors: rest, meds  Cervical spine: pain varies current: 3/10  PRECAUTIONS: Shoulder  RED FLAGS: None     WEIGHT BEARING RESTRICTIONS: No  FALLS:  Has patient fallen in last 6 months? No  LIVING ENVIRONMENT: Lives with: lives with their spouse Lives in: House/apartment Stairs: Yes: External: 7 steps; on right going up Has following equipment at home: None  OCCUPATION: retired  PLOF: Independent  PATIENT GOALS: no surgery needed in my shoulder; decrease pain, be able to move it  NEXT MD VISIT: as needed  OBJECTIVE:  Note: Objective measures were completed at Evaluation unless otherwise noted.  DIAGNOSTIC FINDINGS:  MRI x 2-3 yrs: Chronic back pain mechanical secondary lumbar spondylosis adjacent segment disease above a long fusion L2 to the sacrum.. No focal neurologic deficit   PATIENT SURVEYS:  FOTO Primary score 36% with goal 53%  COGNITION: Overall cognitive status: Within  functional limits for tasks assessed   POSTURE: weight shift right left shoulder depressed vs right  PALPATION: Significant TTP about rotator cuff insertion, left scapular area  CERVICAL ROM:   Active ROM A/PROM (deg) eval  Flexion full  Extension full  Right lateral flexion 25% limited P!  Left lateral flexion Full P!  Right rotation Limited 50% P!  Left rotation full   (Blank rows = not tested)  UPPER EXTREMITY ROM:  LUE wfl RUE shoulder assisted abd ~85d; abd 30d pain limiting; Int shoulder rot: full; external rotation to neutral  UPPER EXTREMITY MMT:  Right wfl Left: elbow 4/5; wrist and forearm 5/5; shoulder 3-  SHOULDER SPECIAL TESTS: Rotator cuff assessment: Drop arm test:pt unable to tolerate due to pain Biceps assessment:   Yergason test left positive   TODAY'S TREATMENT:                                                                                                                                Treatment                            12/11:  STM Lt upper trap Cupping- static to L UT Pulleys flexion Wall slides flex/abd x10ea Door way stretch L shoulder  Theraband row/ext RTB 2x10ea (cues) Ball rolls at table flexion and scaption   Treatment                            12/5:  PROM of shoulder is full and painful when guarded but able to move through full ROM on repeated motions STM  Lt upper trap Supine chest press with bar, cues to maintain chin tuck Seated postural alignment With posture: +cervical rotation, +shoulder rolls, +upper trap stretch, +GHJ ER Pulleys flexion Lt stretch at counter  Standing upright posture, also with gait   PATIENT EDUCATION:  Education details: Discussed eval findings, rehab rationale, aquatic program progression/POC and pools in area. Patient is in agreement  Person educated: Patient Education method: Explanation Education comprehension: verbalized understanding  HOME EXERCISE  PROGRAM: [id number]  ASSESSMENT:  CLINICAL IMPRESSION: Pt with improved pain free movement following manual therapy and AAROM stretching with pulleys. Trialed static cupping to L upper trap to further reduce restrictions here. Continued to work on improve L shoulder ROM and postural strength. Cues required for proper performance with scapular retraction. Educated on avoiding pushing past pan limitations with exercises. Pt to utilize ice at home to manage soreness/pain.   OBJECTIVE IMPAIRMENTS: decreased ROM, decreased strength, impaired UE functional use, and pain.   ACTIVITY LIMITATIONS: carrying, lifting, dressing, and reach over head  PARTICIPATION LIMITATIONS: cleaning  REHAB POTENTIAL: Good  CLINICAL DECISION MAKING: Evolving/moderate complexity  EVALUATION COMPLEXITY: Moderate   GOALS: Goals reviewed with patient? Yes  SHORT TERM GOALS: Target date: 04/11/23  Pt will be indep and compliant with HEP Baseline:  Goal status: MET 12/11  2.  Pt to be seen by orthopedic with potential diagnostics completed of shoulder Baseline:  Goal status: INITIAL  3.  Pt to improve left shoulder active flex to 90d Baseline:  Goal status: INITIAL  4.  Pt to have full cervical ROM without pain limitation Baseline:  Goal status: INITIAL     LONG TERM GOALS: Target date: 05/07/23  Pt to meet stated Foto Goal of 53% Baseline: 36% Goal status: INITIAL  2. Left shoulder strength improved to 3+ or> to demonstrate improved mobility Baseline:  Goal status: INITIAL  3.  Pt will be indep with self care/ADLs reaching up over head wash and groom Baseline:  Goal status: INITIAL  4.  Pt will decrease left shoulder and cervical pain by using ice/heat reporting decreased pain by 3-4 NPRS   Baseline:8/10   Goal Status: INITIAL    PLAN:  PT FREQUENCY: 1-2x/week  PT DURATION: 8 weeks  PLANNED INTERVENTIONS: 97164- PT Re-evaluation, 97110-Therapeutic exercises, 97530- Therapeutic  activity, 97112- Neuromuscular re-education, 97535- Self Care, 11914- Manual therapy, [id number]- Aquatic Therapy, 97760- Splinting, 97014- Electrical stimulation (unattended), 97033- Ionotophoresis 4mg /ml Dexamethasone, Patient/Family education, Taping, Dry Needling, Joint mobilization, DME instructions, Cryotherapy, and Moist heat  PLAN FOR NEXT SESSION: Land based:cervical &  left shoulder/ scapular ROM, strengthening; pain management; continued assessment  Riki Altes, PTA  04/07/23 12:04 PM

## 2023-04-15 ENCOUNTER — Encounter (HOSPITAL_BASED_OUTPATIENT_CLINIC_OR_DEPARTMENT_OTHER): Payer: Self-pay | Admitting: Physical Therapy

## 2023-04-15 ENCOUNTER — Ambulatory Visit (HOSPITAL_BASED_OUTPATIENT_CLINIC_OR_DEPARTMENT_OTHER): Payer: Medicare HMO | Admitting: Physical Therapy

## 2023-04-15 DIAGNOSIS — G8929 Other chronic pain: Secondary | ICD-10-CM

## 2023-04-15 DIAGNOSIS — M25512 Pain in left shoulder: Secondary | ICD-10-CM | POA: Diagnosis not present

## 2023-04-15 DIAGNOSIS — M6281 Muscle weakness (generalized): Secondary | ICD-10-CM

## 2023-04-15 DIAGNOSIS — M542 Cervicalgia: Secondary | ICD-10-CM

## 2023-04-15 NOTE — Therapy (Signed)
OUTPATIENT PHYSICAL THERAPY CERVICAL EVALUATION   Patient Name: Angelica Carter MRN: [id number] DOB:Oct 24, 1955, 67 y.o., female Today's Date: 04/15/2023  END OF SESSION:      Past Medical History:  Diagnosis Date   ADHD (attention deficit hyperactivity disorder)    diagnosed in childhood; unspecified type but most likely inattentive   Allergy    Aortic atherosclerosis    Arthritis    Asthma    Chronic diastolic heart failure (HCC)    Chronic kidney disease (CKD)    born with only one kidney   COPD (chronic obstructive pulmonary disease)    Degenerative lumbar spinal stenosis 08/10/2016   Fibromyalgia    Generalized anxiety disorder    History of concussion    Left foot drop 07/14/2017   LLQ pain 02/08/2020   Low back pain 07/14/2017   Major depressive disorder    Obstructive sleep apnea    Not currently using CPAP machine as machine is broken; awaiting parts   Paroxysmal supraventricular tachycardia (HCC)    TIA (transient ischemic attack) 2009   Tuberculosis    Past Surgical History:  Procedure Laterality Date   BACK SURGERY     x 4    TUBAL LIGATION     Patient Active Problem List   Diagnosis Date Noted   Aortic atherosclerosis    Generalized anxiety disorder    Major depressive disorder    ADHD (attention deficit hyperactivity disorder)    TIA (transient ischemic attack)    Fibromyalgia    History of concussion    Chronic kidney disease (CKD)    COPD (chronic obstructive pulmonary disease)    Obstructive sleep apnea    LLQ pain 02/08/2020   Diarrhea 02/08/2020   Left foot drop 07/14/2017   Low back pain 07/14/2017   Degenerative lumbar spinal stenosis 08/10/2016    PCP: Vladimir Crofts, FNP  REFERRING PROVIDER: Vladimir Crofts, FNP   REFERRING DIAG:   Cervicalgia  M25.512 (ICD-10-CM) - Pain in left shoulder    THERAPY DIAG:  No diagnosis found.  Rationale for Evaluation and Treatment: Rehabilitation  ONSET DATE: 2 years  SUBJECTIVE:                                                                                                                                                                                                          SUBJECTIVE STATEMENT: Pt reports compliance with HEP. "It helped when she released the muscle." Had a few days of relief but did start hurting again a couple days ago.  Hand dominance: Left  PERTINENT HISTORY:  Pt reports fibromyalgia  PAIN:  Are you having pain? Yes: NPRS scale: 7/10 Pain location: left ant shoulder Pain description: sharpe/ache Aggravating factors: movement Relieving factors: rest, meds  Cervical spine: pain varies current: 3/10  PRECAUTIONS: Shoulder  RED FLAGS: None     WEIGHT BEARING RESTRICTIONS: No  FALLS:  Has patient fallen in last 6 months? No  LIVING ENVIRONMENT: Lives with: lives with their spouse Lives in: House/apartment Stairs: Yes: External: 7 steps; on right going up Has following equipment at home: None  OCCUPATION: retired  PLOF: Independent  PATIENT GOALS: no surgery needed in my shoulder; decrease pain, be able to move it  NEXT MD VISIT: as needed  OBJECTIVE:  Note: Objective measures were completed at Evaluation unless otherwise noted.  DIAGNOSTIC FINDINGS:  MRI x 2-3 yrs: Chronic back pain mechanical secondary lumbar spondylosis adjacent segment disease above a long fusion L2 to the sacrum.. No focal neurologic deficit   PATIENT SURVEYS:  FOTO Primary score 36% with goal 53%  COGNITION: Overall cognitive status: Within functional limits for tasks assessed   POSTURE: weight shift right left shoulder depressed vs right  PALPATION: Significant TTP about rotator cuff insertion, left scapular area  CERVICAL ROM:   Active ROM A/PROM (deg) eval  Flexion full  Extension full  Right lateral flexion 25% limited P!  Left lateral flexion Full P!  Right rotation Limited 50% P!  Left rotation full   (Blank rows = not  tested)  UPPER EXTREMITY ROM:  LUE wfl RUE shoulder assisted abd ~85d; abd 30d pain limiting; Int shoulder rot: full; external rotation to neutral  UPPER EXTREMITY MMT:  Right wfl Left: elbow 4/5; wrist and forearm 5/5; shoulder 3-  SHOULDER SPECIAL TESTS: Rotator cuff assessment: Drop arm test:pt unable to tolerate due to pain Biceps assessment:   Yergason test left positive   TODAY'S TREATMENT:                                                                                                                               12/19  Manual: assessment of trigger points. Trigger point release to upper trap; manual cervical traction   Supine Wand flexion 2x8  Theraband row/ext RTB 2x10ea (cues)  Reviewed and updated HEP   Trigger Point Dry-Needling  Treatment instructions: Expect mild to moderate muscle soreness. S/S of pneumothorax if dry needled over a lung field, and to seek immediate medical attention should they occur. Patient verbalized understanding of these instructions and education.  Patient Consent Given: Yes Education handout provided: Yes Muscles treated: 2x in left upper using a .30x50 needle  Electrical stimulation performed: No Parameters: N/A Treatment response/outcome: great twitch       Treatment                            12/11:  STM Lt upper trap Cupping- static to L UT Pulleys flexion  Wall slides flex/abd x10ea Door way stretch L shoulder  Theraband row/ext RTB 2x10ea (cues) Ball rolls at table flexion and scaption   Treatment                            12/5:  PROM of shoulder is full and painful when guarded but able to move through full ROM on repeated motions STM Lt upper trap Supine chest press with bar, cues to maintain chin tuck Seated postural alignment With posture: +cervical rotation, +shoulder rolls, +upper trap stretch, +GHJ ER Pulleys flexion Lt stretch at counter  Standing upright posture, also with gait   PATIENT  EDUCATION:  Education details: Discussed eval findings, rehab rationale, aquatic program progression/POC and pools in area. Patient is in agreement  Person educated: Patient Education method: Explanation Education comprehension: verbalized understanding  HOME EXERCISE PROGRAM: [id number]  ASSESSMENT:  CLINICAL IMPRESSION: Therapist performed trial of trigger point dry needling to the patient's left upper trap.  She had a great twitch response.  We reviewed how to reduce post needle soreness.  We reviewed current HEP.  Last visit she was given rows and shoulder extensions.  We updated and put those on the HEP.  She was also given bands to work on her exercises at home.  Was advised to work on the stretches and exercises at home to see which ones are most effective for her.  Therapy will continue to progress as tolerated.  OBJECTIVE IMPAIRMENTS: decreased ROM, decreased strength, impaired UE functional use, and pain.   ACTIVITY LIMITATIONS: carrying, lifting, dressing, and reach over head  PARTICIPATION LIMITATIONS: cleaning  REHAB POTENTIAL: Good  CLINICAL DECISION MAKING: Evolving/moderate complexity  EVALUATION COMPLEXITY: Moderate   GOALS: Goals reviewed with patient? Yes  SHORT TERM GOALS: Target date: 04/11/23  Pt will be indep and compliant with HEP Baseline:  Goal status: MET 12/11  2.  Pt to be seen by orthopedic with potential diagnostics completed of shoulder Baseline:  Goal status: INITIAL  3.  Pt to improve left shoulder active flex to 90d Baseline:  Goal status: INITIAL  4.  Pt to have full cervical ROM without pain limitation Baseline:  Goal status: INITIAL     LONG TERM GOALS: Target date: 05/07/23  Pt to meet stated Foto Goal of 53% Baseline: 36% Goal status: INITIAL  2. Left shoulder strength improved to 3+ or> to demonstrate improved mobility Baseline:  Goal status: INITIAL  3.  Pt will be indep with self care/ADLs reaching up over head wash  and groom Baseline:  Goal status: INITIAL  4.  Pt will decrease left shoulder and cervical pain by using ice/heat reporting decreased pain by 3-4 NPRS   Baseline:8/10   Goal Status: INITIAL    PLAN:  PT FREQUENCY: 1-2x/week  PT DURATION: 8 weeks  PLANNED INTERVENTIONS: 97164- PT Re-evaluation, 97110-Therapeutic exercises, 97530- Therapeutic activity, 97112- Neuromuscular re-education, 97535- Self Care, 01027- Manual therapy, [id number]- Aquatic Therapy, 97760- Splinting, 97014- Electrical stimulation (unattended), 97033- Ionotophoresis 4mg /ml Dexamethasone, Patient/Family education, Taping, Dry Needling, Joint mobilization, DME instructions, Cryotherapy, and Moist heat  PLAN FOR NEXT SESSION: Land based:cervical &  left shoulder/ scapular ROM, strengthening; pain management; continued assessment  Lorayne Bender PT DPT  04/15/23 12:15 PM

## 2023-04-23 ENCOUNTER — Encounter (HOSPITAL_BASED_OUTPATIENT_CLINIC_OR_DEPARTMENT_OTHER): Payer: Medicare HMO

## 2023-04-29 ENCOUNTER — Ambulatory Visit (HOSPITAL_BASED_OUTPATIENT_CLINIC_OR_DEPARTMENT_OTHER): Payer: Medicare Other | Attending: Family | Admitting: Physical Therapy

## 2023-04-29 ENCOUNTER — Encounter (HOSPITAL_BASED_OUTPATIENT_CLINIC_OR_DEPARTMENT_OTHER): Payer: Self-pay | Admitting: Physical Therapy

## 2023-04-29 ENCOUNTER — Telehealth (HOSPITAL_BASED_OUTPATIENT_CLINIC_OR_DEPARTMENT_OTHER): Payer: Self-pay | Admitting: Physical Therapy

## 2023-04-29 DIAGNOSIS — M25512 Pain in left shoulder: Secondary | ICD-10-CM | POA: Insufficient documentation

## 2023-04-29 DIAGNOSIS — M6281 Muscle weakness (generalized): Secondary | ICD-10-CM | POA: Insufficient documentation

## 2023-04-29 DIAGNOSIS — M542 Cervicalgia: Secondary | ICD-10-CM | POA: Insufficient documentation

## 2023-04-29 DIAGNOSIS — G8929 Other chronic pain: Secondary | ICD-10-CM | POA: Insufficient documentation

## 2023-04-29 NOTE — Telephone Encounter (Signed)
 Called patient regarding no-show appointment on 1/2. Patient did not answer and mail-box is full. Will call again if able.

## 2023-05-06 ENCOUNTER — Ambulatory Visit (HOSPITAL_BASED_OUTPATIENT_CLINIC_OR_DEPARTMENT_OTHER): Payer: Medicare Other | Admitting: Physical Therapy

## 2023-05-13 ENCOUNTER — Ambulatory Visit (HOSPITAL_BASED_OUTPATIENT_CLINIC_OR_DEPARTMENT_OTHER): Payer: Medicare Other

## 2023-05-20 ENCOUNTER — Ambulatory Visit (HOSPITAL_BASED_OUTPATIENT_CLINIC_OR_DEPARTMENT_OTHER): Payer: Medicare Other | Admitting: Physical Therapy

## 2023-05-20 ENCOUNTER — Telehealth (HOSPITAL_BASED_OUTPATIENT_CLINIC_OR_DEPARTMENT_OTHER): Payer: Self-pay | Admitting: Physical Therapy

## 2023-05-20 NOTE — Telephone Encounter (Signed)
Attempted to contact pt via phone- VM box has not been set up. Does not have active Mychart for messages.  Pt will be d/c at next visit if no show or late cancel per attendance policy.   Angelica Carter C. Doil Kamara PT, DPT 05/20/23 12:43 PM

## 2023-05-20 NOTE — Therapy (Incomplete)
OUTPATIENT PHYSICAL THERAPY CERVICAL EVALUATION   Patient Name: Angelica Carter MRN: [id number] DOB:Nov 24, 1955, 68 y.o., female Today's Date: 05/20/2023  END OF SESSION:      Past Medical History:  Diagnosis Date   ADHD (attention deficit hyperactivity disorder)    diagnosed in childhood; unspecified type but most likely inattentive   Allergy    Aortic atherosclerosis    Arthritis    Asthma    Chronic diastolic heart failure (HCC)    Chronic kidney disease (CKD)    born with only one kidney   COPD (chronic obstructive pulmonary disease)    Degenerative lumbar spinal stenosis 08/10/2016   Fibromyalgia    Generalized anxiety disorder    History of concussion    Left foot drop 07/14/2017   LLQ pain 02/08/2020   Low back pain 07/14/2017   Major depressive disorder    Obstructive sleep apnea    Not currently using CPAP machine as machine is broken; awaiting parts   Paroxysmal supraventricular tachycardia (HCC)    TIA (transient ischemic attack) 2009   Tuberculosis    Past Surgical History:  Procedure Laterality Date   BACK SURGERY     x 4    TUBAL LIGATION     Patient Active Problem List   Diagnosis Date Noted   Aortic atherosclerosis    Generalized anxiety disorder    Major depressive disorder    ADHD (attention deficit hyperactivity disorder)    TIA (transient ischemic attack)    Fibromyalgia    History of concussion    Chronic kidney disease (CKD)    COPD (chronic obstructive pulmonary disease)    Obstructive sleep apnea    LLQ pain 02/08/2020   Diarrhea 02/08/2020   Left foot drop 07/14/2017   Low back pain 07/14/2017   Degenerative lumbar spinal stenosis 08/10/2016    PCP: Vladimir Crofts, FNP  REFERRING PROVIDER: Vladimir Crofts, FNP   REFERRING DIAG:   Cervicalgia  M25.512 (ICD-10-CM) - Pain in left shoulder    THERAPY DIAG:  No diagnosis found.  Rationale for Evaluation and Treatment: Rehabilitation  ONSET DATE: 2 years  SUBJECTIVE:                                                                                                                                                                                                          SUBJECTIVE STATEMENT: ***  Hand dominance: Left  PERTINENT HISTORY:  Pt reports fibromyalgia  PAIN:  Are you having pain? Yes: NPRS scale: 7/10 Pain location: left ant shoulder Pain description: sharpe/ache Aggravating  factors: movement Relieving factors: rest, meds  Cervical spine: pain varies current: 3/10  PRECAUTIONS: Shoulder  RED FLAGS: None     WEIGHT BEARING RESTRICTIONS: No  FALLS:  Has patient fallen in last 6 months? No  LIVING ENVIRONMENT: Lives with: lives with their spouse Lives in: House/apartment Stairs: Yes: External: 7 steps; on right going up Has following equipment at home: None  OCCUPATION: retired  PLOF: Independent  PATIENT GOALS: no surgery needed in my shoulder; decrease pain, be able to move it  NEXT MD VISIT: as needed  OBJECTIVE:  Note: Objective measures were completed at Evaluation unless otherwise noted.  DIAGNOSTIC FINDINGS:  MRI x 2-3 yrs: Chronic back pain mechanical secondary lumbar spondylosis adjacent segment disease above a long fusion L2 to the sacrum.. No focal neurologic deficit   PATIENT SURVEYS:  FOTO Primary score 36% with goal 53%  COGNITION: Overall cognitive status: Within functional limits for tasks assessed   POSTURE: weight shift right left shoulder depressed vs right  PALPATION: Significant TTP about rotator cuff insertion, left scapular area  CERVICAL ROM:   Active ROM A/PROM (deg) eval  Flexion full  Extension full  Right lateral flexion 25% limited P!  Left lateral flexion Full P!  Right rotation Limited 50% P!  Left rotation full   (Blank rows = not tested)  UPPER EXTREMITY ROM:  LUE wfl RUE shoulder assisted abd ~85d; abd 30d pain limiting; Int shoulder rot: full; external rotation to  neutral  UPPER EXTREMITY MMT:  Right wfl Left: elbow 4/5; wrist and forearm 5/5; shoulder 3-  SHOULDER SPECIAL TESTS: Rotator cuff assessment: Drop arm test:pt unable to tolerate due to pain Biceps assessment:   Yergason test left positive   TODAY'S TREATMENT:                                                                                                                               Treatment                            ***:  ***goals & recert   12/19  Manual: assessment of trigger points. Trigger point release to upper trap; manual cervical traction   Supine Wand flexion 2x8  Theraband row/ext RTB 2x10ea (cues)  Reviewed and updated HEP   Trigger Point Dry-Needling  Treatment instructions: Expect mild to moderate muscle soreness. S/S of pneumothorax if dry needled over a lung field, and to seek immediate medical attention should they occur. Patient verbalized understanding of these instructions and education.  Patient Consent Given: Yes Education handout provided: Yes Muscles treated: 2x in left upper using a .30x50 needle  Electrical stimulation performed: No Parameters: N/A Treatment response/outcome: great twitch       Treatment                            12/11:  STM Lt  upper trap Cupping- static to L UT Pulleys flexion Wall slides flex/abd x10ea Door way stretch L shoulder  Theraband row/ext RTB 2x10ea (cues) Ball rolls at table flexion and scaption     PATIENT EDUCATION:  Education details: Discussed eval findings, rehab rationale, aquatic program progression/POC and pools in area. Patient is in agreement  Person educated: Patient Education method: Explanation Education comprehension: verbalized understanding  HOME EXERCISE PROGRAM: [id number]  ASSESSMENT:  CLINICAL IMPRESSION: ***  OBJECTIVE IMPAIRMENTS: decreased ROM, decreased strength, impaired UE functional use, and pain.   ACTIVITY LIMITATIONS: carrying, lifting, dressing, and reach  over head  PARTICIPATION LIMITATIONS: cleaning  REHAB POTENTIAL: Good  CLINICAL DECISION MAKING: Evolving/moderate complexity  EVALUATION COMPLEXITY: Moderate   GOALS: Goals reviewed with patient? Yes  SHORT TERM GOALS: Target date: 04/11/23  Pt will be indep and compliant with HEP Baseline:  Goal status: MET 12/11  2.  Pt to be seen by orthopedic with potential diagnostics completed of shoulder Baseline:  Goal status: INITIAL  3.  Pt to improve left shoulder active flex to 90d Baseline:  Goal status: INITIAL  4.  Pt to have full cervical ROM without pain limitation Baseline:  Goal status: INITIAL     LONG TERM GOALS: Target date: 05/07/23  Pt to meet stated Foto Goal of 53% Baseline: 36% Goal status: INITIAL  2. Left shoulder strength improved to 3+ or> to demonstrate improved mobility Baseline:  Goal status: INITIAL  3.  Pt will be indep with self care/ADLs reaching up over head wash and groom Baseline:  Goal status: INITIAL  4.  Pt will decrease left shoulder and cervical pain by using ice/heat reporting decreased pain by 3-4 NPRS   Baseline:8/10   Goal Status: INITIAL    PLAN:  PT FREQUENCY: 1-2x/week  PT DURATION: 8 weeks  PLANNED INTERVENTIONS: 97164- PT Re-evaluation, 97110-Therapeutic exercises, 97530- Therapeutic activity, [id number]- Neuromuscular re-education, 97535- Self Care, 16109- Manual therapy, [id number]- Aquatic Therapy, 97760- Splinting, 97014- Electrical stimulation (unattended), 97033- Ionotophoresis 4mg /ml Dexamethasone, Patient/Family education, Taping, Dry Needling, Joint mobilization, DME instructions, Cryotherapy, and Moist heat  PLAN FOR NEXT SESSION: Land based:cervical &  left shoulder/ scapular ROM, strengthening; pain management; continued assessment  Satchel Heidinger C. Bayle Calvo PT, DPT 05/20/23 10:07 AM

## 2023-05-24 ENCOUNTER — Encounter: Payer: Self-pay | Admitting: Family Medicine

## 2023-05-27 ENCOUNTER — Ambulatory Visit (HOSPITAL_BASED_OUTPATIENT_CLINIC_OR_DEPARTMENT_OTHER): Payer: Medicare Other | Admitting: Physical Therapy

## 2023-11-16 ENCOUNTER — Ambulatory Visit: Admitting: Podiatry

## 2023-11-16 DIAGNOSIS — M7751 Other enthesopathy of right foot: Secondary | ICD-10-CM

## 2023-11-16 DIAGNOSIS — B351 Tinea unguium: Secondary | ICD-10-CM | POA: Diagnosis not present

## 2023-11-16 DIAGNOSIS — M7752 Other enthesopathy of left foot: Secondary | ICD-10-CM | POA: Diagnosis not present

## 2023-11-16 DIAGNOSIS — M79671 Pain in right foot: Secondary | ICD-10-CM

## 2023-11-16 DIAGNOSIS — M79672 Pain in left foot: Secondary | ICD-10-CM

## 2023-11-16 NOTE — Progress Notes (Signed)
 Patient presents for evaluation and treatment of tenderness and some redness around nails feet.  Tenderness around toes with walking and wearing shoes.  Some bunions with tenderness at times.  She tries to avoid shoes that bother them.  Physical exam:  General appearance: Alert, pleasant, and in no acute distress.  Vascular: Pedal pulses: DP 2/4 B/L, PT 1/4 B/L.  Mild to moderate edema lower legs bilaterally.  Capillary refill time normal bilaterally  Neurological:  Light touch intact.  Normal Achilles tendon reflex bilaterally  Dermatologic:  Nails thickened, disfigured, discolored 1-5 BL with subungual debris.  Redness and hypertrophic nail folds along nail folds bilaterally but no signs of drainage or infection.  Skin somewhat thin and atrophic with no hair growth in lower extremity and irregular areas of hyperpigmentation  Musculoskeletal:  HAV deformities bilaterally.  Normal muscle strength lower extremity.  Some tenderness at dorsal medial aspect first MTP bilaterally   Diagnosis: 1. Painful onychomycotic nails 1 through 5 bilaterally. 2. Pain toes 1 through 5 bilaterally. 3.  Bursitis first MTP medially bilaterally  Plan: -New patient office visit for evaluation and management level 3.  Modifier 25. - Discussed with her bunion deformities and the tenderness resulting from them at times.  Recommend she just avoid shoes that cause pressure on these areas.  Indicated and any kind of nonmedicated padding is fine.  Discussed socks. -Debrided onychomycotic nails 1 through 5 bilaterally.  Return prn

## 2023-11-30 NOTE — Progress Notes (Unsigned)
 Ellouise Console, PA-C [address] [place], [place]  [address] Phone: [phone]   Gastroenterology Consultation  Referring Provider:     Rik Glinda DASEN, FNP Primary Care Physician:  Rik Glinda DASEN, FNP Primary Gastroenterologist:  Ellouise Console, PA-C / Elspeth Naval, MD  Reason for Consultation:     Chronic loose stools, repeat colonoscopy, history of colon polyps        HPI:   Marcile Fuquay is a 68 y.o. y/o female referred for consultation & management  by Rik Glinda DASEN, FNP.    Current symptoms: Patient has chronic loose stools.  Takes dicyclomine  20 Mg 3 times daily as needed IBS.  Pantoprazole 40 Mg once daily for GERD.  07/2019 last colonoscopy by Dr. Naval: 7 tubular adenoma polyps removed.  Ranged in size from 3 mm to 10 mm.  No dysplasia.  Pandiverticulosis.  Adequate prep.  Biopsies negative for microscopic colitis.  3 year repeat (was due 07/2022).  Patient's mother had colon cancer age 44s.  PMH: COPD, TIA, sleep apnea, CKD, ADHD, fibromyalgia, anxiety.  10/2021 echo showed LVEF 57%.  Past Medical History:  Diagnosis Date   ADHD (attention deficit hyperactivity disorder)    diagnosed in childhood; unspecified type but most likely inattentive   Allergy    Aortic atherosclerosis    Arthritis    Asthma    Chronic diastolic heart failure (HCC)    Chronic kidney disease (CKD)    born with only one kidney   COPD (chronic obstructive pulmonary disease)    Degenerative lumbar spinal stenosis 08/10/2016   Fibromyalgia    Generalized anxiety disorder    History of concussion    Left foot drop 07/14/2017   LLQ pain 02/08/2020   Low back pain 07/14/2017   Major depressive disorder    Obstructive sleep apnea    Not currently using CPAP machine as machine is broken; awaiting parts   Paroxysmal supraventricular tachycardia (HCC)    TIA (transient ischemic attack) 2009   Tuberculosis     Past Surgical History:  Procedure Laterality Date   BACK SURGERY      x 4    TUBAL LIGATION      Prior to Admission medications   Medication Sig Start Date End Date Taking? Authorizing Provider  amLODipine (NORVASC) 10 MG tablet Take 10 mg by mouth daily.    [provider]  buPROPion (WELLBUTRIN XL) 150 MG 24 hr tablet Take 150 mg by mouth daily. 08/17/19   [provider]  dicyclomine  (BENTYL ) 20 MG tablet Take 1 tablet (20 mg total) by mouth 3 (three) times daily as needed for spasms. 04/02/20   Armbruster, Elspeth SQUIBB, MD  DIFLUCAN 150 MG tablet Take by mouth daily. 02/07/20   [provider]  HYDROcodone-acetaminophen (NORCO/VICODIN) 5-325 MG tablet Take 1 tablet by mouth 2 (two) times daily. 01/24/20   [provider]  ondansetron (ZOFRAN) 4 MG tablet Take 4 mg by mouth 3 (three) times daily as needed. 10/27/19   [provider]  pantoprazole (PROTONIX) 40 MG tablet Take 40 mg by mouth daily. 10/27/19   [provider]  predniSONE  (STERAPRED UNI-PAK 21 TAB) 10 MG (21) TBPK tablet Take by mouth daily. Take 6 tabs by mouth daily  for 2 days, then 5 tabs for 2 days, then 4 tabs for 2 days, then 3 tabs for 2 days, 2 tabs for 2 days, then 1 tab by mouth daily for 2 days 11/06/21  Logan Martinis B, PA-C  pregabalin (LYRICA) 200 MG capsule Take 200 mg by mouth 3 (three) times daily.    [provider]  SENNOSIDES-DOCUSATE SODIUM PO Take by mouth.    [provider]  SUMAtriptan (IMITREX) 25 MG tablet Take 25 mg by mouth every 2 (two) hours as needed for migraine. May repeat in 2 hours if headache persists or recurs.    [provider]  SYMBICORT 80-4.5 MCG/ACT inhaler Inhale 1 puff into the lungs 2 (two) times daily. 08/17/19   [provider]  traZODone (DESYREL) 50 MG tablet Take 75 mg by mouth at bedtime.    [provider]    Family History  Problem Relation Age of Onset   Colon cancer Mother 44   Heart disease Father    Colon polyps Sister    Stomach cancer Neg Hx     Pancreatic cancer Neg Hx    Esophageal cancer Neg Hx      Social History   Tobacco Use   Smoking status: Light Smoker    Current packs/day: 0.25    Types: Cigarettes   Smokeless tobacco: Never   Tobacco comments:    1.5 packs every 2 weeks  Vaping Use   Vaping status: Never Used  Substance Use Topics   Alcohol use: Not Currently   Drug use: Yes    Types: Marijuana    Comment: Couple nights per week to help with relaxation and sleep difficulties    Allergies as of 12/01/2023 - Review Complete 11/16/2023  Allergen Reaction Noted   Cyclobenzaprine Other (See Comments) 06/02/2019    Review of Systems:    All systems reviewed and negative except where noted in HPI.   Physical Exam:  There were no vitals taken for this visit. No LMP recorded. Patient is postmenopausal.  General:   Alert,  Well-developed, well-nourished, pleasant and cooperative in NAD Lungs:  Respirations even and unlabored.  Clear throughout to auscultation.   No wheezes, crackles, or rhonchi. No acute distress. Heart:  Regular rate and rhythm; no murmurs, clicks, rubs, or gallops. Abdomen:  Normal bowel sounds.  No bruits.  Soft, and non-distended without masses, hepatosplenomegaly or hernias noted.  No Tenderness.  No guarding or rebound tenderness.    Neurologic:  Alert and oriented x3;  grossly normal neurologically. Psych:  Alert and cooperative. Normal mood and affect.  Imaging Studies: No results found.  Labs: CBC    Component Value Date/Time   WBC 11.2 (H) 09/17/2020 1540   RBC 5.21 (H) 09/17/2020 1540   HGB 15.3 (H) 09/17/2020 1549   HCT 45.0 09/17/2020 1549   PLT 219 09/17/2020 1540   MCV 90.4 09/17/2020 1540    CMP     Component Value Date/Time   NA 138 09/17/2020 1549   K 4.2 09/17/2020 1549   CL 105 09/17/2020 1549   CO2 22 09/17/2020 1540   GLUCOSE 84 09/17/2020 1549   BUN 18 09/17/2020 1549   CREATININE 1.10 (H) 09/17/2020 1549   CALCIUM 9.1 09/17/2020 1540   PROT 6.6  09/17/2020 1540   ALBUMIN 3.7 09/17/2020 1540   AST 34 09/17/2020 1540   ALT 54 (H) 09/17/2020 1540   ALKPHOS 72 09/17/2020 1540   BILITOT 0.8 09/17/2020 1540   GFRNONAA 50 (L) 09/17/2020 1540    Assessment and Plan:   Kimiko Common is a 68 y.o. y/o female has been referred for ***  Follow up ***  Ellouise Console, PA-C

## 2023-12-01 ENCOUNTER — Ambulatory Visit (INDEPENDENT_AMBULATORY_CARE_PROVIDER_SITE_OTHER): Admitting: Physician Assistant

## 2023-12-01 ENCOUNTER — Encounter: Payer: Self-pay | Admitting: Physician Assistant

## 2023-12-01 ENCOUNTER — Encounter: Payer: Self-pay | Admitting: Gastroenterology

## 2023-12-01 VITALS — BP 142/80 | HR 89 | Ht 60.0 in | Wt 181.0 lb

## 2023-12-01 DIAGNOSIS — Z800 Family history of malignant neoplasm of digestive organs: Secondary | ICD-10-CM | POA: Diagnosis not present

## 2023-12-01 DIAGNOSIS — Z860100 Personal history of colon polyps, unspecified: Secondary | ICD-10-CM

## 2023-12-01 DIAGNOSIS — K580 Irritable bowel syndrome with diarrhea: Secondary | ICD-10-CM

## 2023-12-01 MED ORDER — DICYCLOMINE HCL 10 MG PO CAPS: 10.0000 mg | ORAL_CAPSULE | Freq: Three times a day (TID) | ORAL | 5 refills | Status: AC

## 2023-12-01 MED ORDER — NA SULFATE-K SULFATE-MG SULF 17.5-3.13-1.6 GM/177ML PO SOLN: 1.0000 | Freq: Once | ORAL | 0 refills | Status: AC

## 2023-12-01 NOTE — Patient Instructions (Addendum)
 We have sent the following medications to your pharmacy for you to pick up at your convenience: Dicyclomine  10 mg 3 times daily before meals  You have been scheduled for a Colonoscopy. Please follow written instructions given to you at your visit today.   If you use inhalers (even only as needed), please bring them with you on the day of your procedure.  DO NOT TAKE 7 DAYS PRIOR TO TEST- Trulicity (dulaglutide) Ozempic, Wegovy (semaglutide) Mounjaro (tirzepatide) Bydureon Bcise (exanatide extended release)  DO NOT TAKE 1 DAY PRIOR TO YOUR TEST Rybelsus (semaglutide) Adlyxin (lixisenatide) Victoza (liraglutide) Byetta (exanatide) ___________________________________________________________________________  Please follow up sooner if symptoms increase or worsen   Due to recent changes in healthcare laws, you may see the results of your imaging and laboratory studies on MyChart before your provider has had a chance to review them.  We understand that in some cases there may be results that are confusing or concerning to you. Not all laboratory results come back in the same time frame and the provider may be waiting for multiple results in order to interpret others.  Please give us  48 hours in order for your provider to thoroughly review all the results before contacting the office for clarification of your results.   Thank you for trusting me with your gastrointestinal care!   Ellouise Console, PA-C _______________________________________________________  If your blood pressure at your visit was 140/90 or greater, please contact your primary care physician to follow up on this.  _______________________________________________________  If you are age 78 or older, your body mass index should be between 23-30. Your Body mass index is 35.35 kg/m. If this is out of the aforementioned range listed, please consider follow up with your Primary Care Provider.  If you are age 62 or younger, your  body mass index should be between 19-25. Your Body mass index is 35.35 kg/m. If this is out of the aformentioned range listed, please consider follow up with your Primary Care Provider.   ________________________________________________________  The [facility] providers would like to encourage you to use MYCHART to communicate with providers for non-urgent requests or questions.  Due to long hold times on the telephone, sending your provider a message by [facility] may be a faster and more efficient way to get a response.  Please allow 48 business hours for a response.  Please remember that this is for non-urgent requests.  _______________________________________________________

## 2023-12-02 NOTE — Progress Notes (Signed)
 Agree with assessment and plan as outlined.

## 2023-12-09 ENCOUNTER — Ambulatory Visit (INDEPENDENT_AMBULATORY_CARE_PROVIDER_SITE_OTHER)

## 2023-12-09 ENCOUNTER — Encounter: Payer: Self-pay | Admitting: Podiatry

## 2023-12-09 ENCOUNTER — Ambulatory Visit (INDEPENDENT_AMBULATORY_CARE_PROVIDER_SITE_OTHER): Admitting: Podiatry

## 2023-12-09 DIAGNOSIS — M21612 Bunion of left foot: Secondary | ICD-10-CM

## 2023-12-09 DIAGNOSIS — M21611 Bunion of right foot: Secondary | ICD-10-CM

## 2023-12-10 NOTE — Progress Notes (Signed)
 Subjective:   Patient ID: Angelica Carter, female   DOB: 68 y.o.   MRN: [id number]   HPI Patient presents stating she wants to get her bunion fixed right states it has been bothering her especially when she tries to walk.  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  Patient wants to get this done needs to wait till later in the year and wants to talk to her husband about scheduling and we reviewed the postoperative.  That will occur.  She is comfortable with this and is motivated to get this fixed and will schedule and will see me prior to go over everything in great detail  X-rays indicate that there is narrowing of the first MPJ but the joint is rounded and there is elevation of the first metatarsal segment with spur formation

## 2024-01-07 ENCOUNTER — Ambulatory Visit: Admitting: Gastroenterology

## 2024-01-07 ENCOUNTER — Encounter: Payer: Self-pay | Admitting: Gastroenterology

## 2024-01-07 VITALS — BP 110/70 | HR 76 | Temp 97.2°F | Resp 14 | Ht 60.0 in | Wt 181.0 lb

## 2024-01-07 DIAGNOSIS — Z860101 Personal history of adenomatous and serrated colon polyps: Secondary | ICD-10-CM

## 2024-01-07 DIAGNOSIS — Z800 Family history of malignant neoplasm of digestive organs: Secondary | ICD-10-CM

## 2024-01-07 DIAGNOSIS — Z1211 Encounter for screening for malignant neoplasm of colon: Secondary | ICD-10-CM

## 2024-01-07 DIAGNOSIS — D120 Benign neoplasm of cecum: Secondary | ICD-10-CM | POA: Diagnosis not present

## 2024-01-07 DIAGNOSIS — K648 Other hemorrhoids: Secondary | ICD-10-CM

## 2024-01-07 DIAGNOSIS — D122 Benign neoplasm of ascending colon: Secondary | ICD-10-CM | POA: Diagnosis not present

## 2024-01-07 DIAGNOSIS — Z860100 Personal history of colon polyps, unspecified: Secondary | ICD-10-CM

## 2024-01-07 DIAGNOSIS — K5730 Diverticulosis of large intestine without perforation or abscess without bleeding: Secondary | ICD-10-CM

## 2024-01-07 DIAGNOSIS — D123 Benign neoplasm of transverse colon: Secondary | ICD-10-CM

## 2024-01-07 MED ORDER — SODIUM CHLORIDE 0.9 % IV SOLN: 500.0000 mL | Freq: Once | INTRAVENOUS | Status: AC

## 2024-01-07 NOTE — Progress Notes (Unsigned)
 [facility] History and Physical   Primary Care Physician:  Rik Glinda DASEN, FNP   Reason for Procedure:   History of colon polyps  Plan:    colonoscopy    HPI: Angelica Carter is a 68 y.o. female  here for colonoscopy surveillance - history of 7 polyps removed 07/2019, largest 1cm in size. Mother had CRC dx at age 48. She has baseline IBS-D, has had biopsies rule out microscopic colitis in the past. Otherwise feels well without any cardiopulmonary symptoms.   I have discussed risks / benefits of anesthesia and endoscopic procedure with [employer] and they wish to proceed with the exams as outlined today.    Past Medical History:  Diagnosis Date   ADHD (attention deficit hyperactivity disorder)    diagnosed in childhood; unspecified type but most likely inattentive   Allergy    Anxiety    Aortic atherosclerosis    Arthritis    Asthma    Chronic diastolic heart failure (HCC)    Chronic kidney disease (CKD)    born with only one kidney   COPD (chronic obstructive pulmonary disease)    Degenerative lumbar spinal stenosis 08/10/2016   Diverticulitis    Fibromyalgia    Generalized anxiety disorder    History of concussion    Left foot drop 07/14/2017   LLQ pain 02/08/2020   Low back pain 07/14/2017   Major depressive disorder    Migraine    Obstructive sleep apnea    Not currently using CPAP machine as machine is broken; awaiting parts   Paroxysmal supraventricular tachycardia (HCC)    Sleep apnea    Stroke [facility])    TIA 2005   TIA (transient ischemic attack) 2009   Tuberculosis    Tubular adenoma of colon     Past Surgical History:  Procedure Laterality Date   BACK SURGERY     x 4    COLONOSCOPY     ESOPHAGOGASTRODUODENOSCOPY     TUBAL LIGATION      Prior to Admission medications   Medication Sig Start Date End Date Taking? Authorizing Provider  pregabalin (LYRICA) 200 MG capsule Take 200 mg by mouth 3 (three) times daily.   Yes [provider]  topiramate (TOPAMAX) 25 MG tablet Take 25 mg by mouth 2 (two) times daily. 12/20/23  Yes [provider]  dicyclomine  (BENTYL ) 10 MG capsule Take 1 capsule (10 mg total) by mouth 3 (three) times daily before meals. 12/01/23 05/29/24  [place], PA-C  hydrOXYzine (ATARAX) 25 MG tablet Take 25 mg by mouth daily.    [provider]  SUMAtriptan (IMITREX) 25 MG tablet Take 25 mg by mouth every 2 (two) hours as needed for migraine. May repeat in 2 hours if headache persists or recurs.    [provider]    Current Outpatient Medications  Medication Sig Dispense Refill   pregabalin (LYRICA) 200 MG capsule Take 200 mg by mouth 3 (three) times daily.     topiramate (TOPAMAX) 25 MG tablet Take 25 mg by mouth 2 (two) times daily.     dicyclomine  (BENTYL ) 10 MG capsule Take 1 capsule (10 mg total) by mouth 3 (three) times daily before meals. 90 capsule 5   hydrOXYzine (ATARAX) 25 MG tablet Take 25 mg by mouth daily.     SUMAtriptan (IMITREX) 25 MG tablet Take 25 mg by mouth every 2 (two) hours as needed for migraine. May repeat in 2 hours if headache persists or recurs.  Current Facility-Administered Medications  Medication Dose Route Frequency Provider Last Rate Last Admin   0.9 %  sodium chloride  infusion  500 mL Intravenous Once Bronislaus Verdell, Elspeth SQUIBB, MD        Allergies as of 01/07/2024 - Review Complete 01/07/2024  Allergen Reaction Noted   Cyclobenzaprine Other (See Comments) 06/02/2019    Family History  Problem Relation Age of Onset   Colon cancer Mother 15   Heart disease Father    Colon polyps Sister    Kidney disease Maternal Aunt    Colon polyps Daughter    Diabetes Daughter    Heart disease Daughter    Stomach cancer Neg Hx    Pancreatic cancer Neg Hx    Esophageal cancer Neg Hx    Rectal cancer Neg Hx     Social History   Socioeconomic History   Marital status: Married    Spouse name: Not on file   Number of children: 2   Years of  education: 14   Highest education level: Some college, no degree  Occupational History   Occupation: Disabled    Comment: prior admin asst  Tobacco Use   Smoking status: Former    Current packs/day: 0.25    Types: Cigarettes   Smokeless tobacco: Never  Vaping Use   Vaping status: Never Used  Substance and Sexual Activity   Alcohol use: Not Currently   Drug use: Yes    Types: Marijuana    Comment: occ.   Sexual activity: Not on file  Other Topics Concern   Not on file  Social History Narrative   Left handed   Drinks caffeine    One story apartment   Social Drivers of Health   Financial Resource Strain: Not on file  Food Insecurity: Not on file  Transportation Needs: Not on file  Physical Activity: Not on file  Stress: Not on file  Social Connections: Unknown (09/09/2021)   Received from [facility]   Social Network    Social Network: Not on file  Intimate Partner Violence: Unknown (08/01/2021)   Received from [facility]   HITS    Physically Hurt: Not on file    Insult or Talk Down To: Not on file    Threaten Physical Harm: Not on file    Scream or Curse: Not on file    Review of Systems: All other review of systems negative except as mentioned in the HPI.  Physical Exam: Vital signs BP 127/72   Pulse 91   Temp (!) 97.2 F (36.2 C) (Temporal)   Ht 5' (1.524 m)   Wt 181 lb (82.1 kg)   SpO2 97%   BMI 35.35 kg/m   General:   Alert,  Well-developed, pleasant and cooperative in NAD Lungs:  Clear throughout to auscultation.   Heart:  Regular rate and rhythm Abdomen:  Soft, nontender and nondistended.   Neuro/Psych:  Alert and cooperative. Normal mood and affect. A and O x 3  Marcey Naval, MD [facility]

## 2024-01-07 NOTE — Progress Notes (Unsigned)
 Called to room to assist during endoscopic procedure.  Patient ID and intended procedure confirmed with present staff. Received instructions for my participation in the procedure from the performing physician.

## 2024-01-07 NOTE — Op Note (Signed)
 [facility] Patient Name: Angelica Carter Procedure Date: 01/07/2024 2:36 PM MRN: [id number] Endoscopist: Elspeth P. Leigh , MD, [id number] Age: 68 Referring MD:  Date of Birth: 1955/08/10 Gender: Female Account #: [id number] Procedure:                Colonoscopy Indications:              High risk colon cancer surveillance: Personal                            history of colonic polyps - last exam 07/2019 - 7                            adenomas largest 1cm in size, mother had colon                            cancer dx age 36 Medicines:                Monitored Anesthesia Care Procedure:                Pre-Anesthesia Assessment:                           - Prior to the procedure, a History and Physical                            was performed, and patient medications and                            allergies were reviewed. The patient's tolerance of                            previous anesthesia was also reviewed. The risks                            and benefits of the procedure and the sedation                            options and risks were discussed with the patient.                            All questions were answered, and informed consent                            was obtained. Prior Anticoagulants: The patient has                            taken no anticoagulant or antiplatelet agents. ASA                            Grade Assessment: III - A patient with severe                            systemic disease. After reviewing the risks and  benefits, the patient was deemed in satisfactory                            condition to undergo the procedure.                           After obtaining informed consent, the colonoscope                            was passed under direct vision. Throughout the                            procedure, the patient's blood pressure, pulse, and                            oxygen saturations were monitored  continuously. The                            Olympus Scope [id number] was introduced through the                            anus and advanced to the the cecum, identified by                            appendiceal orifice and ileocecal valve. The                            colonoscopy was performed without difficulty. The                            patient tolerated the procedure well. The quality                            of the bowel preparation was adequate. The                            ileocecal valve, appendiceal orifice, and rectum                            were photographed. Scope In: 2:55:43 PM Scope Out: 3:13:04 PM Scope Withdrawal Time: 0 hours 14 minutes 44 seconds  Total Procedure Duration: 0 hours 17 minutes 21 seconds  Findings:                 The perianal and digital rectal examinations were                            normal.                           A diminutive polyp was found in the cecum. The                            polyp was sessile. The polyp was removed with a  cold snare. Resection and retrieval were complete.                           A diminutive polyp was found in the ascending                            colon. The polyp was sessile. The polyp was removed                            with a cold snare. Resection and retrieval were                            complete.                           A 5 mm polyp was found in the transverse colon. The                            polyp was sessile. The polyp was removed with a                            cold snare. Resection and retrieval were complete.                           Diverticula were found in the entire colon. Mild in                            right / transverse colon, severe in the left colon.                           Internal hemorrhoids were found. The hemorrhoids                            were small.                           The exam was otherwise without  abnormality. Complications:            No immediate complications. Estimated blood loss:                            Minimal. Estimated Blood Loss:     Estimated blood loss was minimal. Impression:               - One diminutive polyp in the cecum, removed with a                            cold snare. Resected and retrieved.                           - One diminutive polyp in the ascending colon,                            removed with a cold snare. Resected and retrieved.                           -  One 5 mm polyp in the transverse colon, removed                            with a cold snare. Resected and retrieved.                           - Diverticulosis in the entire examined colon.                           - Internal hemorrhoids.                           - The examination was otherwise normal. Recommendation:           - Patient has a contact number available for                            emergencies. The signs and symptoms of potential                            delayed complications were discussed with the                            patient. Return to normal activities tomorrow.                            Written discharge instructions were provided to the                            patient.                           - Resume previous diet.                           - Continue present medications.                           - Await pathology results. Anticipate repeat                            colonoscopy in 5 years. Elspeth P. Lillis Nuttle, MD 01/07/2024 3:19:34 PM This report has been signed electronically.

## 2024-01-07 NOTE — Patient Instructions (Addendum)
 Resume previous diet Continue present medications Await pathology results  Handouts/information given for polyps, diverticulosis  YOU HAD AN ENDOSCOPIC PROCEDURE TODAY AT THE [facility]:   Refer to the procedure report that was given to you for any specific questions about what was found during the examination.  If the procedure report does not answer your questions, please call your gastroenterologist to clarify.  If you requested that your care partner not be given the details of your procedure findings, then the procedure report has been included in a sealed envelope for you to review at your convenience later.  YOU SHOULD EXPECT: Some feelings of bloating in the abdomen. Passage of more gas than usual.  Walking can help get rid of the air that was put into your GI tract during the procedure and reduce the bloating. If you had a lower endoscopy (such as a colonoscopy or flexible sigmoidoscopy) you may notice spotting of blood in your stool or on the toilet paper. If you underwent a bowel prep for your procedure, you may not have a normal bowel movement for a few days.  Please Note:  You might notice some irritation and congestion in your nose or some drainage.  This is from the oxygen used during your procedure.  There is no need for concern and it should clear up in a day or so.  SYMPTOMS TO REPORT IMMEDIATELY:  Following lower endoscopy (colonoscopy):  Excessive amounts of blood in the stool  Significant tenderness or worsening of abdominal pains  Swelling of the abdomen that is new, acute  Fever of 100F or higher  For urgent or emergent issues, a gastroenterologist can be reached at any hour by calling [phone]. Do not use MyChart messaging for urgent concerns.    DIET:  We do recommend a small meal at first, but then you may proceed to your regular diet.  Drink plenty of fluids but you should avoid alcoholic beverages for 24 hours.  ACTIVITY:  You should plan to  take it easy for the rest of today and you should NOT DRIVE or use heavy machinery until tomorrow (because of the sedation medicines used during the test).    FOLLOW UP: Our staff will call the number listed on your records the next business day following your procedure.  We will call around 7:15- 8:00 am to check on you and address any questions or concerns that you may have regarding the information given to you following your procedure. If we do not reach you, we will leave a message.     If any biopsies were taken you will be contacted by phone or by letter within the next 1-3 weeks.  Please call us  at [phone] if you have not heard about the biopsies in 3 weeks.    SIGNATURES/CONFIDENTIALITY: You and/or your care partner have signed paperwork which will be entered into your electronic medical record.  These signatures attest to the fact that that the information above on your After Visit Summary has been reviewed and is understood.  Full responsibility of the confidentiality of this discharge information lies with you and/or your care-partner.

## 2024-01-07 NOTE — Progress Notes (Unsigned)
 Sedate, gd SR, tolerated procedure well, VSS, report to RN

## 2024-01-10 ENCOUNTER — Telehealth: Payer: Self-pay

## 2024-01-10 NOTE — Telephone Encounter (Signed)
 Follow up call to pt, no answer.

## 2024-01-12 LAB — SURGICAL PATHOLOGY

## 2024-01-16 ENCOUNTER — Ambulatory Visit: Payer: Self-pay | Admitting: Gastroenterology
